# Patient Record
Sex: Female | Born: 1965 | Race: Black or African American | Hispanic: No | Marital: Married | Smoking: Former smoker
Health system: Southern US, Community
[De-identification: ages and names within clinical notes are randomized; demographics above are authoritative.]

## PROBLEM LIST (undated history)

## (undated) DIAGNOSIS — D649 Anemia, unspecified: Secondary | ICD-10-CM

## (undated) DIAGNOSIS — J189 Pneumonia, unspecified organism: Secondary | ICD-10-CM

## (undated) DIAGNOSIS — E119 Type 2 diabetes mellitus without complications: Secondary | ICD-10-CM

## (undated) DIAGNOSIS — E039 Hypothyroidism, unspecified: Secondary | ICD-10-CM

## (undated) DIAGNOSIS — M795 Residual foreign body in soft tissue: Secondary | ICD-10-CM

## (undated) DIAGNOSIS — F419 Anxiety disorder, unspecified: Secondary | ICD-10-CM

## (undated) HISTORY — PX: BREAST BIOPSY: SHX20

## (undated) HISTORY — PX: TUBAL LIGATION: SHX77

## (undated) HISTORY — PX: COLONOSCOPY W/ POLYPECTOMY: SHX1380

## (undated) HISTORY — PX: OTHER SURGICAL HISTORY: SHX169

---

## 2005-02-22 ENCOUNTER — Emergency Department: Payer: Self-pay | Admitting: Emergency Medicine

## 2005-05-29 ENCOUNTER — Emergency Department: Payer: Self-pay | Admitting: Emergency Medicine

## 2005-06-25 ENCOUNTER — Emergency Department: Payer: Self-pay | Admitting: Internal Medicine

## 2006-05-12 ENCOUNTER — Ambulatory Visit: Payer: Self-pay | Admitting: General Practice

## 2006-09-26 ENCOUNTER — Emergency Department: Payer: Self-pay | Admitting: Emergency Medicine

## 2006-10-06 ENCOUNTER — Other Ambulatory Visit: Payer: Self-pay

## 2006-10-06 ENCOUNTER — Emergency Department: Payer: Self-pay | Admitting: Emergency Medicine

## 2008-06-03 ENCOUNTER — Emergency Department: Payer: Self-pay | Admitting: Internal Medicine

## 2008-06-07 ENCOUNTER — Ambulatory Visit: Payer: Self-pay | Admitting: Internal Medicine

## 2009-02-07 ENCOUNTER — Ambulatory Visit: Payer: Self-pay | Admitting: Family Medicine

## 2009-02-15 ENCOUNTER — Ambulatory Visit: Payer: Self-pay | Admitting: Family Medicine

## 2009-08-21 ENCOUNTER — Ambulatory Visit: Payer: Self-pay | Admitting: General Surgery

## 2009-10-25 ENCOUNTER — Ambulatory Visit: Payer: Self-pay | Admitting: Gastroenterology

## 2010-08-31 ENCOUNTER — Emergency Department: Payer: Self-pay | Admitting: Emergency Medicine

## 2010-09-02 ENCOUNTER — Emergency Department: Payer: Self-pay | Admitting: Emergency Medicine

## 2011-02-05 ENCOUNTER — Emergency Department: Payer: Self-pay | Admitting: Unknown Physician Specialty

## 2011-08-11 ENCOUNTER — Emergency Department: Payer: Self-pay | Admitting: Emergency Medicine

## 2012-03-02 ENCOUNTER — Emergency Department: Payer: Self-pay | Admitting: Internal Medicine

## 2012-06-28 ENCOUNTER — Emergency Department: Payer: Self-pay | Admitting: Emergency Medicine

## 2013-02-20 ENCOUNTER — Emergency Department: Payer: Self-pay | Admitting: Emergency Medicine

## 2013-02-20 LAB — CBC
MCH: 29.5 pg (ref 26.0–34.0)
MCHC: 34.2 g/dL (ref 32.0–36.0)
Platelet: 264 10*3/uL (ref 150–440)
RDW: 13.8 % (ref 11.5–14.5)

## 2013-02-20 LAB — BASIC METABOLIC PANEL
Calcium, Total: 9.3 mg/dL (ref 8.5–10.1)
Chloride: 110 mmol/L — ABNORMAL HIGH (ref 98–107)
Co2: 23 mmol/L (ref 21–32)
Creatinine: 0.98 mg/dL (ref 0.60–1.30)
Glucose: 101 mg/dL — ABNORMAL HIGH (ref 65–99)
Osmolality: 289 (ref 275–301)
Sodium: 144 mmol/L (ref 136–145)

## 2013-02-20 LAB — TROPONIN I: Troponin-I: <0.02 ng/mL

## 2013-03-02 ENCOUNTER — Inpatient Hospital Stay: Payer: Self-pay | Admitting: Psychiatry

## 2013-03-02 LAB — COMPREHENSIVE METABOLIC PANEL
Albumin: 3.9 g/dL (ref 3.4–5.0)
Calcium, Total: 9 mg/dL (ref 8.5–10.1)
Creatinine: 0.88 mg/dL (ref 0.60–1.30)
EGFR (Non-African Amer.): >60
SGOT(AST): 29 U/L (ref 15–37)
Total Protein: 7.2 g/dL (ref 6.4–8.2)

## 2013-03-02 LAB — CBC
HCT: 39.4 % (ref 35.0–47.0)
HGB: 13.3 g/dL (ref 12.0–16.0)
MCH: 29.1 pg (ref 26.0–34.0)
MCHC: 33.7 g/dL (ref 32.0–36.0)
Platelet: 237 10*3/uL (ref 150–440)
RDW: 13.6 % (ref 11.5–14.5)

## 2013-03-02 LAB — DRUG SCREEN, URINE
Amphetamines, Ur Screen: NEGATIVE (ref ?–1000)
Benzodiazepine, Ur Scrn: NEGATIVE (ref ?–200)
Cocaine Metabolite,Ur ~~LOC~~: NEGATIVE (ref ?–300)
MDMA (Ecstasy)Ur Screen: NEGATIVE (ref ?–500)
Methadone, Ur Screen: NEGATIVE (ref ?–300)
Opiate, Ur Screen: NEGATIVE (ref ?–300)
Phencyclidine (PCP) Ur S: NEGATIVE (ref ?–25)

## 2013-03-02 LAB — URINALYSIS, COMPLETE
Bilirubin,UR: NEGATIVE
Glucose,UR: NEGATIVE mg/dL (ref 0–75)
Ketone: NEGATIVE
Nitrite: NEGATIVE
Ph: 5 (ref 4.5–8.0)
Specific Gravity: 1.005 (ref 1.003–1.030)

## 2013-03-02 LAB — TSH: Thyroid Stimulating Horm: 3.48 u[IU]/mL

## 2013-03-02 LAB — ETHANOL: Ethanol %: 0.201 % — ABNORMAL HIGH (ref 0.000–0.080)

## 2013-03-03 LAB — BEHAVIORAL MEDICINE 1 PANEL
Alkaline Phosphatase: 99 U/L (ref 50–136)
Anion Gap: 6 — ABNORMAL LOW (ref 7–16)
Basophil #: 0.1 10*3/uL (ref 0.0–0.1)
Bilirubin,Total: 0.4 mg/dL (ref 0.2–1.0)
Chloride: 109 mmol/L — ABNORMAL HIGH (ref 98–107)
Co2: 27 mmol/L (ref 21–32)
Creatinine: 0.91 mg/dL (ref 0.60–1.30)
EGFR (Non-African Amer.): >60
Eosinophil #: 0.1 10*3/uL (ref 0.0–0.7)
Eosinophil %: 2.2 %
Lymphocyte %: 49.5 %
MCHC: 33.4 g/dL (ref 32.0–36.0)
MCV: 86 fL (ref 80–100)
Monocyte #: 0.6 x10 3/mm (ref 0.2–0.9)
Monocyte %: 9.3 %
Neutrophil #: 2.4 10*3/uL (ref 1.4–6.5)
Potassium: 4.1 mmol/L (ref 3.5–5.1)
RBC: 4.45 10*6/uL (ref 3.80–5.20)
SGPT (ALT): 37 U/L (ref 12–78)
Thyroid Stimulating Horm: 3.83 u[IU]/mL
Total Protein: 6.5 g/dL (ref 6.4–8.2)
WBC: 6.4 10*3/uL (ref 3.6–11.0)

## 2014-04-13 ENCOUNTER — Ambulatory Visit: Payer: Self-pay

## 2014-06-14 ENCOUNTER — Ambulatory Visit: Payer: Self-pay | Admitting: Internal Medicine

## 2014-06-28 ENCOUNTER — Ambulatory Visit: Payer: Self-pay | Admitting: Internal Medicine

## 2014-12-08 NOTE — Discharge Summary (Signed)
PATIENT NAME:  Tina Payne, Tina Payne MR#:  [id number] DATE OF BIRTH:  [birth year]  DATE OF ADMISSION:  03/02/2013 DATE OF DISCHARGE:  03/04/2013  HOSPITAL COURSE: See dictated history and physical for details of admission. This 49 year old woman presented to the hospital seeking detox from alcohol. She gave a history of having recently had an escalation of her drinking and feeling like it had become a clear problem for her. In the hospital, the patient was treated with the usual detox protocol, showed no seizures however, no delirium, did not require any treatment for withdrawal. Vital signs remained stable. The patient's mood was initially somewhat dysphoric, but improved once she had sobered up. At the time of discharge was denying any suicidal ideation, said that her mood is feeling much. The patient was offered the opportunity of being referred for inpatient substance abuse treatment and was counseled about the utility of that.  She declined the offer. She instead preferred to follow up with outpatient treatment in the community. There was no indication for any specific medication. She was referred to contact the intensive outpatient program here at the hospital and to follow up there, also educated about Alcoholics Anonymous. The patient was educated about alcohol abuse and supported in maintaining sobriety.   DISCHARGE MEDICATIONS: None.   LABORATORY RESULTS: Admission labs showed drug screen was all negative. TSH normal at 3.48. Alcohol level 201. Chemistry panel with slightly elevated glucose at 122, on a nonfasting draw. CBC normal. Urinalysis unremarkable. Follow-up chemistry panel showed normal glucose.   MENTAL STATUS EXAMINATION AT DISCHARGE: Neatly dressed and groomed woman, looks her stated age, cooperative with the interview. Good eye contact. Normal psychomotor activity. Speech normal rate, tone and volume. Affect euthymic, reactive, appropriate. Mood stated as good. Thoughts lucid and directed  with no sign of loosening associations or delusions. Denies auditory or visual hallucinations. Denied suicidal or homicidal ideation. Good insight and judgment. Normal intelligence. Alert and oriented x 4.   DISPOSITION: Discharge home. Follow up with the intensive outpatient program and Alcoholics Anonymous.   DIAGNOSIS, PRINCIPAL AND PRIMARY:  AXIS I: Alcohol dependence.   SECONDARY DIAGNOSES: AXIS I: Substance-induced depression. AXIS II:  Deferred.  AXIS III: No diagnosis.  AXIS IV: Moderate - stress from relationship issues.  AXIS V: Functioning at time of discharge 60.  ____________________________ Audery Amel, MD jtc:sb D: 03/10/2013 11:54:38 ET T: 03/10/2013 12:14:48 ET JOB#: [id number]  cc: Audery Amel, MD, <Dictator> Audery Amel MD ELECTRONICALLY SIGNED 03/11/2013 9:26
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
## 2014-12-08 NOTE — Consult Note (Signed)
PATIENT NAME:  Tina Payne, Tina Payne MR#:  [id number] DATE OF BIRTH:  May 25, 1966  DATE OF CONSULTATION:  03/02/2013  REFERRING PHYSICIAN:   CONSULTING PHYSICIAN:  Izola Price. Jaclynn Major, MD  CHIEF COMPLAINTL: "I have been drinking every day, I'm sick off it."   HISTORY OF PRESENT ILLNESS: [name] is 49 year old female, who presents saying that she wants to be detoxed. She reports that she has talked to her administrator and her charge working nurse and wants to stop. "I'm about to have a nervous breakdown." "I drink every day. When I am not working I do not know of any time when I am not drinking and I do not want to hurt myself and I am here for you to help me."   SUICIDE ASSESSMENT: She denies any suicidal ideation, intent or plan. She reports that she does have support from her daughter, although "my husband is an alcoholic, he drinks all the time." She denies any history of suicide attempts.   She denies any homicidal ideation, intent or plan, but she is a heavy substance abuser. She does not have any thoughts of harming others.   ALCOHOL AND DRUG USE: She denies any use of drugs.   She reports that she first started drinking when she was 49 years old and she last used on 03/01/2013. She reports that she drinks a six pack to 12 beers 7 days of the week. She denies any history of seizures or blackouts. She reports that she would not know if she is having withdrawals, but she does feel sort of headachy and tired. Mrs. Acors BAL was 201 on admission.   MENTAL STATUS EXAM: [name] is appropriately dressed and groomed. She is cooperative and pleasant. She denies any problems sleeping. She appears to be anxious and depressed. She has thought content that is sad and anxious. Her thought processes are linear, logical and goal oriented. Her memory is intact. She denies any auditory, visual hallucinations or delusions. She denies any suicidal or homicidal ideations, intent or plan. She is  oriented x 4. Her speech is soft and slightly slurred. Her insight and judgment are fair. She reports that she has lost about 30 pounds in the last year.   SOCIAL HISTORY: [name] reports she lives with her husband and does not believe her living situation is stable because he drinks,  but she says that she can return there if she needs to. She feels safe there. She reports her daughter is her support system. She says of her family, her daughter is one that helps her.   MEDICATIONS: None.   ALLERGIES: No known drug allergies.   PAST MEDICAL/SURGICAL HISTORY: Gestational diabetes, tubal ligation, laparoscopy and a tubal pregnancy.    DIAGNOSIS, PRINCIPAL AND PRIMARY: Alcohol dependence.   TREATMENT PLAN: Admit [name] to [facility] psychiatric unit for detox.  ____________________________ Izola Price Jaclynn Major, MD fcg:aw D: 03/02/2013 14:22:38 ET T: 03/02/2013 14:37:57 ET JOB#: [id number]  cc: Izola Price. Jaclynn Major, MD, <Dictator> Maryan Puls MD ELECTRONICALLY SIGNED 03/02/2013 15:17

## 2016-03-10 IMAGING — MG MM DIGITAL SCREENING BILAT W/ CAD
2 series · 5 of 5 positions shown · non-contrast
Comparison: Previous Exam(s)

CLINICAL DATA: Screening.

EXAM:
DIGITAL SCREENING BILATERAL MAMMOGRAM WITH CAD

[R CC · right · 4 of 4 slices shown]
[im 1/4]
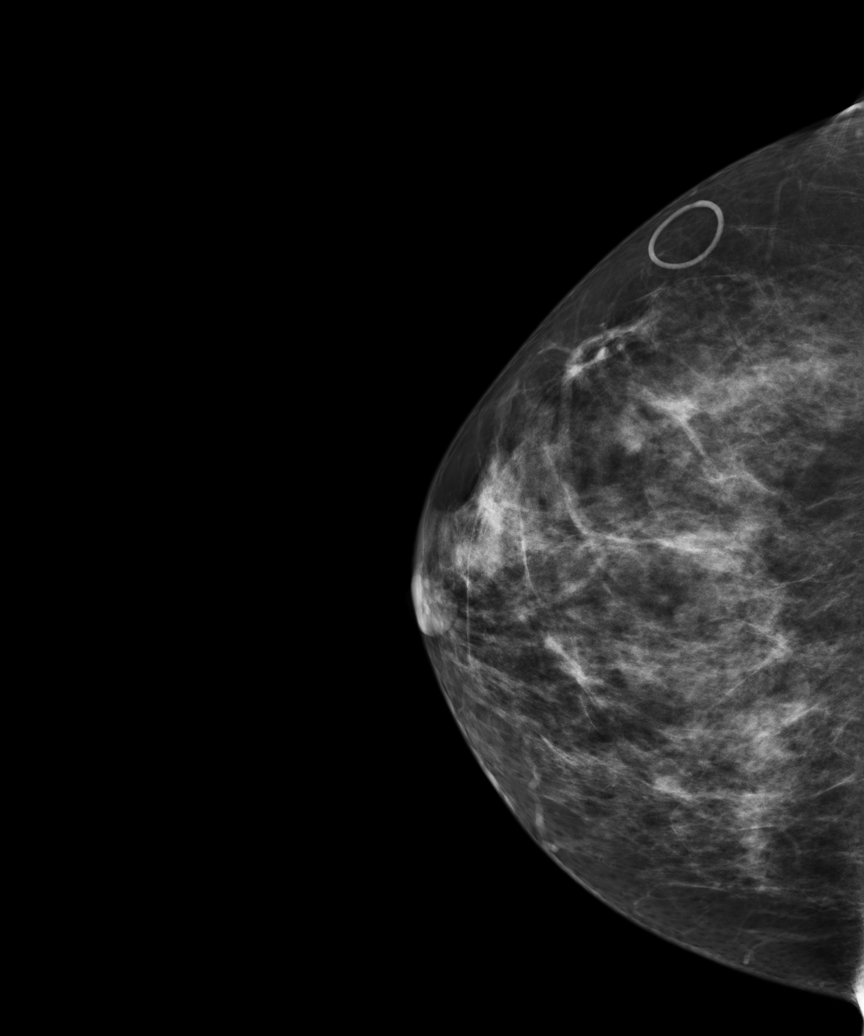
[im 2/4]
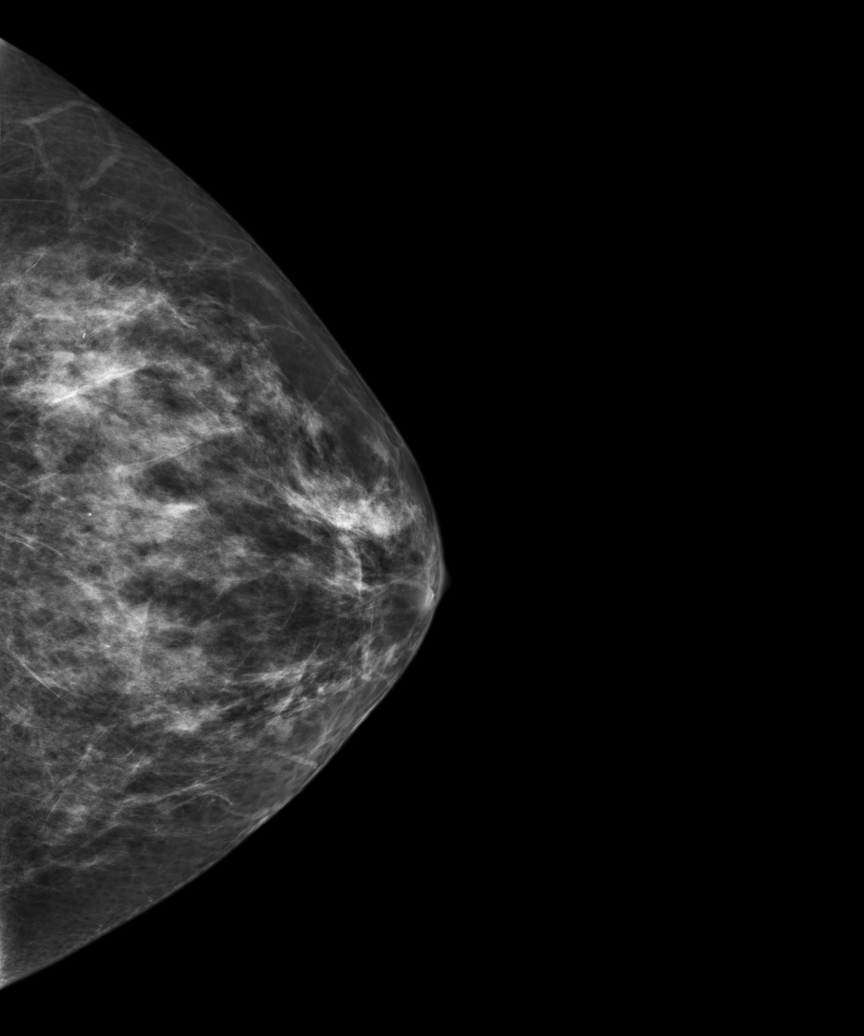
[im 3/4]
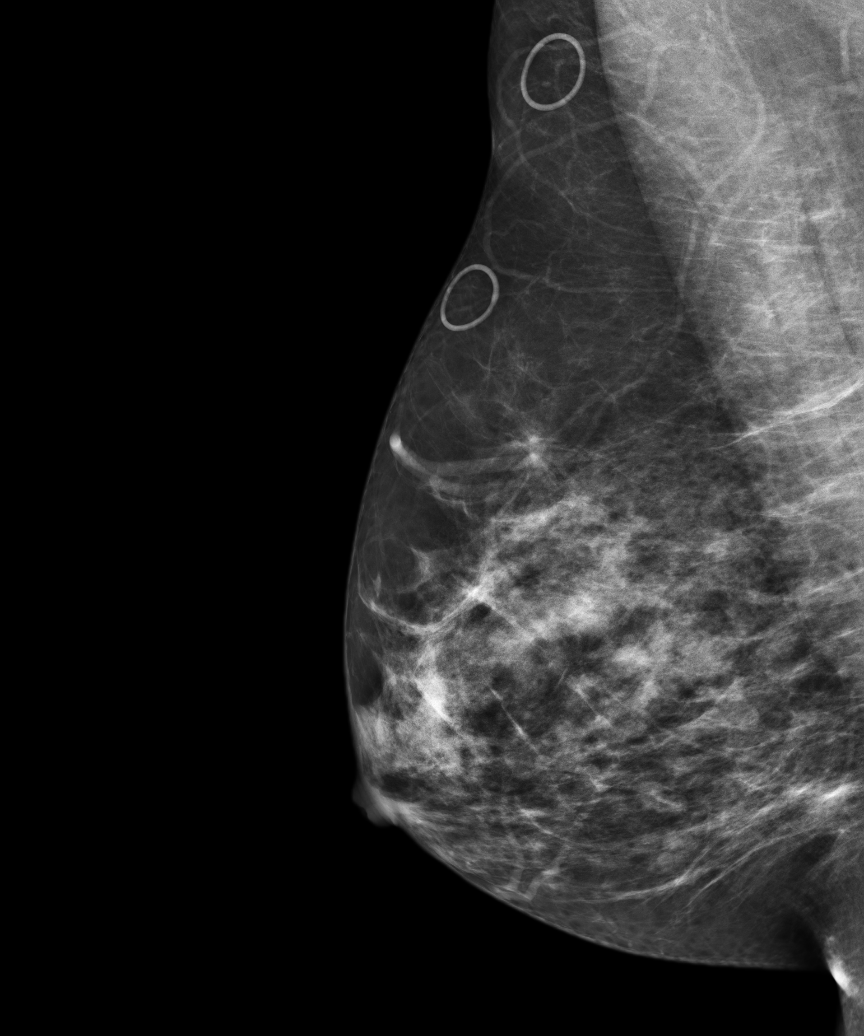
[im 4/4]
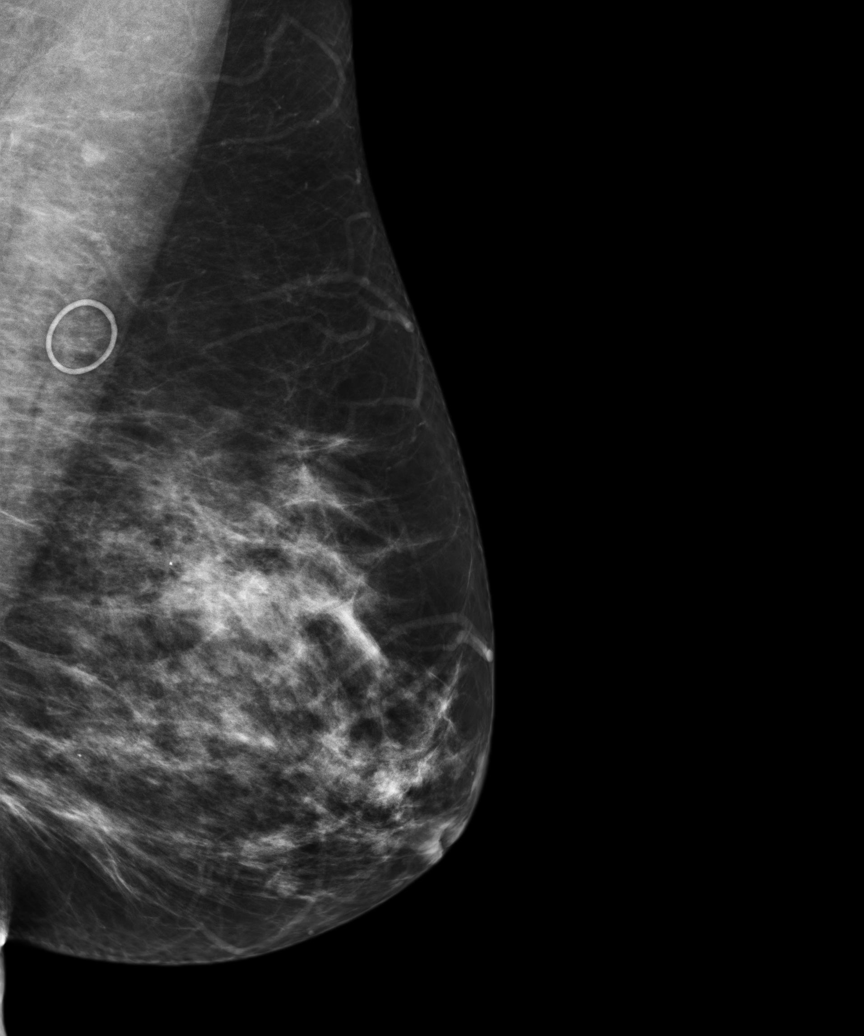

[R MLO]
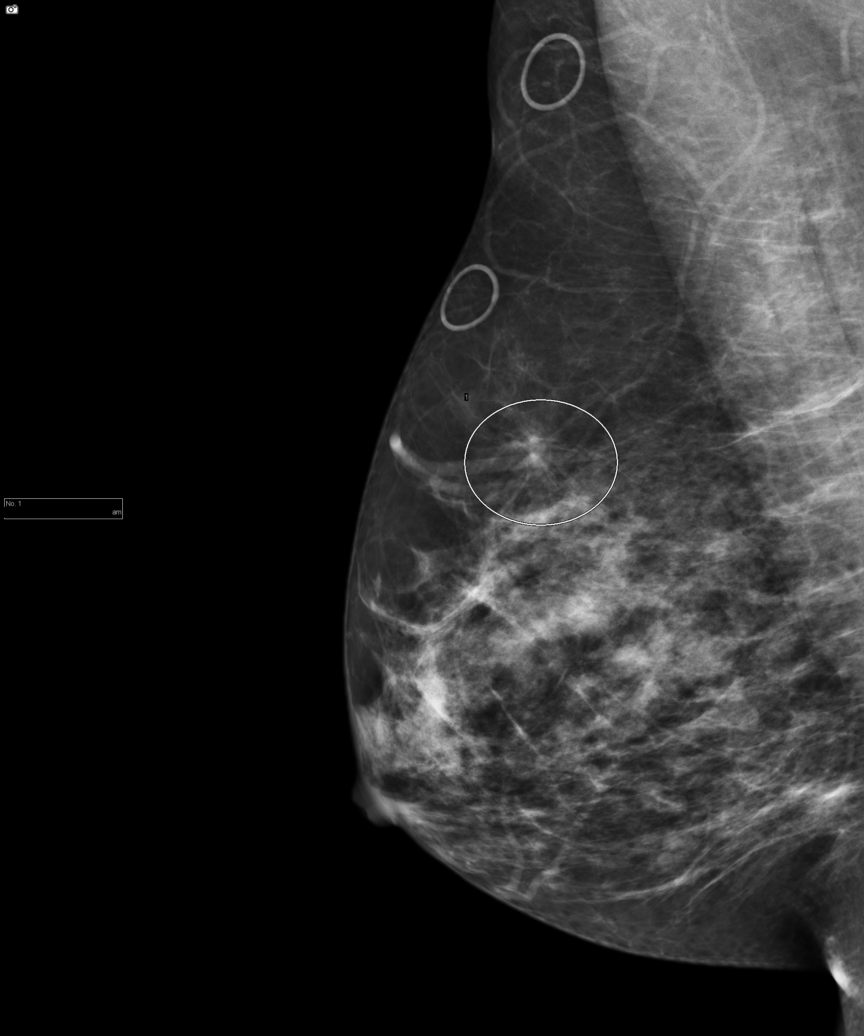

[5 of 5 positions shown; findings below may reference images not displayed]

ACR Breast Density Category c: The breast tissue is heterogeneously
dense, which may obscure small masses.
FINDINGS: In the right breast, possible distortion warrants further evaluation
with spot compression views and possibly ultrasound. In the left
breast, no findings suspicious for malignancy. Images were processed
with CAD.
IMPRESSION: Further evaluation is suggested for possible distortion in the right
breast.

RECOMMENDATION:
Diagnostic mammogram and possibly ultrasound of the right breast.
(Code:[id number])

The patient will be contacted regarding the findings, and additional
imaging will be scheduled.

BI-RADS CATEGORY  0: Incomplete. Need additional imaging evaluation
and/or prior mammograms for comparison.

## 2017-06-05 ENCOUNTER — Emergency Department
Admission: EM | Admit: 2017-06-05 | Discharge: 2017-06-05 | Disposition: A | Discharge status: Home / Self Care | Payer: BLUE CROSS/BLUE SHIELD | Attending: Emergency Medicine | Admitting: Emergency Medicine

## 2017-06-05 ENCOUNTER — Encounter: Payer: Self-pay | Admitting: Emergency Medicine

## 2017-06-05 DIAGNOSIS — J069 Acute upper respiratory infection, unspecified: Secondary | ICD-10-CM | POA: Diagnosis not present

## 2017-06-05 DIAGNOSIS — F17210 Nicotine dependence, cigarettes, uncomplicated: Secondary | ICD-10-CM

## 2017-06-05 DIAGNOSIS — F17200 Nicotine dependence, unspecified, uncomplicated: Secondary | ICD-10-CM | POA: Insufficient documentation

## 2017-06-05 DIAGNOSIS — R0981 Nasal congestion: Secondary | ICD-10-CM | POA: Diagnosis present

## 2017-06-05 MED ORDER — PSEUDOEPH-BROMPHEN-DM 30-2-10 MG/5ML PO SYRP: 5.0000 mL | ORAL_SOLUTION | Freq: Four times a day (QID) | ORAL | 0 refills | Status: DC | PRN

## 2017-06-05 NOTE — Discharge Instructions (Signed)
Follow-up with your primary care provider or [facility] if any continued problems. Begin taking cough medication as directed and increase fluids. Decrease smoking.

## 2017-06-05 NOTE — ED Triage Notes (Addendum)
Pt to ed with c/o cough, congestion and sore throat that started last Sunday.  Pt states can't sleep due to coughing at night.  Pt with mask on at triage. Denies fever. Breathing east and unlabored at triage.

## 2017-06-05 NOTE — ED Provider Notes (Signed)
[facility] Emergency Department Provider Note  ____________________________________________   First MD Initiated Contact with Patient 06/05/17 1230     (approximate)  I have reviewed the triage vital signs and the nursing notes.   HISTORY  Chief Complaint Cough and Nasal Congestion   HPI Tina Payne is a 51 y.o. female is here with complaint of congestion, sore throat and cough for last 5 days. Patient has not been taking any medication as she was seen in [facility] and placed on a muscle relaxant. She was confused if she could take any medication for her cough with this medication. She denies any fever. Patient continues to smoke.  History reviewed. No pertinent past medical history.  There are no active problems to display for this patient.   History reviewed. No pertinent surgical history.  Prior to Admission medications   Medication Sig Start Date End Date Taking? Authorizing Provider  brompheniramine-pseudoephedrine-DM 30-2-10 MG/5ML syrup Take 5 mLs by mouth 4 (four) times daily as needed. 06/05/17   Tommi RumpsSummers, Rhonda L, PA-C    Allergies Patient has no known allergies.  History reviewed. No pertinent family history.  Social History Social History  Substance Use Topics  . Smoking status: Current Every Day Smoker  . Smokeless tobacco: Never Used  . Alcohol use No    Review of Systems Constitutional: No fever/chills ENT: No sore throat. Cardiovascular: Denies chest pain. Respiratory: Denies shortness of breath.  Positive for cough. Gastrointestinal: No abdominal pain.  No nausea, no vomiting.  Musculoskeletal: Negative for back pain. Neurological: Negative for headaches ____________________________________________   PHYSICAL EXAM:  VITAL SIGNS: ED Triage Vitals  Enc Vitals Group     BP 06/05/17 1122 138/90     Pulse Rate 06/05/17 1122 78     Resp 06/05/17 1122 18     Temp 06/05/17 1122 98.4 F (36.9 C)     Temp  Source 06/05/17 1122 Oral     SpO2 06/05/17 1122 99 %     Weight 06/05/17 1122 164 lb (74.4 kg)     Height 06/05/17 1122 5\' 8"  (1.727 m)     Head Circumference --      Peak Flow --      Pain Score 06/05/17 1134 7     Pain Loc --      Pain Edu? --      Excl. in GC? --    Constitutional: Alert and oriented. Well appearing and in no acute distress. Eyes: Conjunctivae are normal.  Head: Atraumatic. Neck: No stridor.   Ears: EACs and TMs are clear bilaterally. Nasal mucosa boggy. Throat:  Moderate posterior drainage noted. No exudates or erythema present. Cardiovascular: Normal rate, regular rhythm. Grossly normal heart sounds.  Good peripheral circulation. Respiratory: Normal respiratory effort.  No retractions. Lungs CTAB.  Course cough. Musculoskeletal: moves upper and lower extremities without any difficulty. Normal gait was noted. Neurologic:  Normal speech and language. No gross focal neurologic deficits are appreciated.  Skin:  Skin is warm, dry and intact.  Psychiatric: Mood and affect are normal. Speech and behavior are normal.  ____________________________________________   LABS (all labs ordered are listed, but only abnormal results are displayed)  Labs Reviewed - No data to display   PROCEDURES  Procedure(s) performed: None  Procedures  Critical Care performed: No  ____________________________________________   INITIAL IMPRESSION / ASSESSMENT AND PLAN / ED COURSE  Patient is given a prescription for Bromfed-DM. She is encouraged to increase fluids and decrease smoking. She is  to follow-up with her PCP up in no clinic if any continued problem's.   ___________________________________________   FINAL CLINICAL IMPRESSION(S) / ED DIAGNOSES  Final diagnoses:  Upper respiratory tract infection, unspecified type  Cigarette smoker      NEW MEDICATIONS STARTED DURING THIS VISIT:  Discharge Medication List as of 06/05/2017 12:51 PM    START taking these  medications   Details  brompheniramine-pseudoephedrine-DM 30-2-10 MG/5ML syrup Take 5 mLs by mouth 4 (four) times daily as needed., Starting Fri 06/05/2017, Print         Note:  This document was prepared using Dragon voice recognition software and may include unintentional dictation errors.    Tommi Rumps, PA-C 06/05/17 1728    Minna Antis, MD 06/08/17 1549

## 2017-10-07 ENCOUNTER — Other Ambulatory Visit: Payer: Self-pay | Admitting: Internal Medicine

## 2017-10-07 DIAGNOSIS — R739 Hyperglycemia, unspecified: Secondary | ICD-10-CM | POA: Insufficient documentation

## 2017-10-07 DIAGNOSIS — E7800 Pure hypercholesterolemia, unspecified: Secondary | ICD-10-CM | POA: Insufficient documentation

## 2017-10-07 DIAGNOSIS — Z1231 Encounter for screening mammogram for malignant neoplasm of breast: Secondary | ICD-10-CM

## 2017-10-27 ENCOUNTER — Ambulatory Visit
Admission: RE | Admit: 2017-10-27 | Discharge: 2017-10-27 | Disposition: A | Discharge status: Home / Self Care | Payer: BLUE CROSS/BLUE SHIELD | Source: Ambulatory Visit | Attending: Internal Medicine | Admitting: Internal Medicine

## 2017-10-27 DIAGNOSIS — Z1231 Encounter for screening mammogram for malignant neoplasm of breast: Secondary | ICD-10-CM | POA: Diagnosis present

## 2018-04-12 DIAGNOSIS — E063 Autoimmune thyroiditis: Secondary | ICD-10-CM | POA: Insufficient documentation

## 2018-04-12 DIAGNOSIS — E119 Type 2 diabetes mellitus without complications: Secondary | ICD-10-CM | POA: Insufficient documentation

## 2018-04-12 DIAGNOSIS — E1165 Type 2 diabetes mellitus with hyperglycemia: Secondary | ICD-10-CM | POA: Insufficient documentation

## 2019-03-14 ENCOUNTER — Other Ambulatory Visit: Payer: Self-pay | Admitting: Internal Medicine

## 2019-03-14 DIAGNOSIS — Z1231 Encounter for screening mammogram for malignant neoplasm of breast: Secondary | ICD-10-CM

## 2019-06-08 DIAGNOSIS — D649 Anemia, unspecified: Secondary | ICD-10-CM | POA: Insufficient documentation

## 2019-07-27 ENCOUNTER — Ambulatory Visit
Admission: RE | Admit: 2019-07-27 | Discharge: 2019-07-27 | Disposition: A | Discharge status: Home / Self Care | Payer: Managed Care, Other (non HMO) | Source: Ambulatory Visit | Attending: Internal Medicine | Admitting: Internal Medicine

## 2019-07-27 ENCOUNTER — Other Ambulatory Visit: Payer: Self-pay

## 2019-07-27 DIAGNOSIS — Z1231 Encounter for screening mammogram for malignant neoplasm of breast: Secondary | ICD-10-CM | POA: Insufficient documentation

## 2019-09-13 DIAGNOSIS — E538 Deficiency of other specified B group vitamins: Secondary | ICD-10-CM | POA: Insufficient documentation

## 2019-09-13 DIAGNOSIS — Z87891 Personal history of nicotine dependence: Secondary | ICD-10-CM | POA: Insufficient documentation

## 2020-04-26 ENCOUNTER — Other Ambulatory Visit: Payer: Self-pay | Admitting: Internal Medicine

## 2020-04-26 DIAGNOSIS — Z1231 Encounter for screening mammogram for malignant neoplasm of breast: Secondary | ICD-10-CM

## 2020-09-07 ENCOUNTER — Other Ambulatory Visit: Payer: Self-pay | Admitting: Internal Medicine

## 2020-09-07 DIAGNOSIS — Z1231 Encounter for screening mammogram for malignant neoplasm of breast: Secondary | ICD-10-CM

## 2020-10-05 ENCOUNTER — Other Ambulatory Visit: Payer: Self-pay

## 2020-10-05 ENCOUNTER — Ambulatory Visit
Admission: RE | Admit: 2020-10-05 | Discharge: 2020-10-05 | Disposition: A | Discharge status: Home / Self Care | Payer: Managed Care, Other (non HMO) | Source: Ambulatory Visit | Attending: Internal Medicine | Admitting: Internal Medicine

## 2020-10-05 DIAGNOSIS — Z1231 Encounter for screening mammogram for malignant neoplasm of breast: Secondary | ICD-10-CM | POA: Diagnosis present

## 2021-01-24 ENCOUNTER — Other Ambulatory Visit: Payer: Self-pay

## 2021-01-24 ENCOUNTER — Emergency Department
Admission: EM | Admit: 2021-01-24 | Discharge: 2021-01-24 | Disposition: A | Discharge status: Home / Self Care | Payer: BC Managed Care – PPO | Attending: Emergency Medicine | Admitting: Emergency Medicine

## 2021-01-24 ENCOUNTER — Encounter: Payer: Self-pay | Admitting: Emergency Medicine

## 2021-01-24 ENCOUNTER — Emergency Department: Payer: BC Managed Care – PPO

## 2021-01-24 DIAGNOSIS — Z87891 Personal history of nicotine dependence: Secondary | ICD-10-CM | POA: Diagnosis not present

## 2021-01-24 DIAGNOSIS — E119 Type 2 diabetes mellitus without complications: Secondary | ICD-10-CM | POA: Diagnosis not present

## 2021-01-24 DIAGNOSIS — E039 Hypothyroidism, unspecified: Secondary | ICD-10-CM | POA: Diagnosis not present

## 2021-01-24 DIAGNOSIS — B349 Viral infection, unspecified: Secondary | ICD-10-CM | POA: Insufficient documentation

## 2021-01-24 DIAGNOSIS — J029 Acute pharyngitis, unspecified: Secondary | ICD-10-CM | POA: Diagnosis present

## 2021-01-24 DIAGNOSIS — Z20822 Contact with and (suspected) exposure to covid-19: Secondary | ICD-10-CM | POA: Insufficient documentation

## 2021-01-24 LAB — RESP PANEL BY RT-PCR (FLU A&B, COVID) ARPGX2
Influenza A by PCR: NEGATIVE
Influenza B by PCR: NEGATIVE
SARS Coronavirus 2 by RT PCR: NEGATIVE

## 2021-01-24 LAB — CBG MONITORING, ED: Glucose-Capillary: 175 mg/dL — ABNORMAL HIGH (ref 70–99)

## 2021-01-24 MED ORDER — PSEUDOEPHEDRINE HCL 30 MG PO TABS: 30.0000 mg | ORAL_TABLET | Freq: Four times a day (QID) | ORAL | 2 refills | Status: AC | PRN
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
## 2021-01-24 NOTE — ED Triage Notes (Signed)
Patient ambulatory to triage with steady gait, without difficulty or distress noted; pt reports sore throat since Monday with sinus drainage and prod cough brown sputum

## 2021-01-24 NOTE — ED Notes (Signed)
Patient transported to X-ray 

## 2021-01-24 NOTE — ED Notes (Signed)
ED Provider at bedside. 

## 2021-09-20 ENCOUNTER — Other Ambulatory Visit: Payer: Self-pay | Admitting: Internal Medicine

## 2021-09-20 DIAGNOSIS — Z1231 Encounter for screening mammogram for malignant neoplasm of breast: Secondary | ICD-10-CM

## 2021-10-24 ENCOUNTER — Ambulatory Visit
Admission: RE | Admit: 2021-10-24 | Discharge: 2021-10-24 | Disposition: A | Discharge status: Home / Self Care | Payer: Commercial Managed Care - HMO | Source: Ambulatory Visit | Attending: Internal Medicine | Admitting: Internal Medicine

## 2021-10-24 ENCOUNTER — Other Ambulatory Visit: Payer: Self-pay

## 2021-10-24 DIAGNOSIS — Z1231 Encounter for screening mammogram for malignant neoplasm of breast: Secondary | ICD-10-CM | POA: Diagnosis present

## 2022-10-03 ENCOUNTER — Emergency Department
Admission: EM | Admit: 2022-10-03 | Discharge: 2022-10-03 | Disposition: A | Discharge status: Home / Self Care | Payer: 59 | Attending: Emergency Medicine | Admitting: Emergency Medicine

## 2022-10-03 ENCOUNTER — Other Ambulatory Visit: Payer: Self-pay

## 2022-10-03 DIAGNOSIS — J028 Acute pharyngitis due to other specified organisms: Secondary | ICD-10-CM | POA: Insufficient documentation

## 2022-10-03 DIAGNOSIS — J029 Acute pharyngitis, unspecified: Secondary | ICD-10-CM

## 2022-10-03 DIAGNOSIS — E119 Type 2 diabetes mellitus without complications: Secondary | ICD-10-CM | POA: Diagnosis not present

## 2022-10-03 DIAGNOSIS — R070 Pain in throat: Secondary | ICD-10-CM | POA: Diagnosis not present

## 2022-10-03 DIAGNOSIS — Z20822 Contact with and (suspected) exposure to covid-19: Secondary | ICD-10-CM | POA: Diagnosis not present

## 2022-10-03 DIAGNOSIS — B9789 Other viral agents as the cause of diseases classified elsewhere: Secondary | ICD-10-CM | POA: Insufficient documentation

## 2022-10-03 LAB — RESP PANEL BY RT-PCR (RSV, FLU A&B, COVID)  RVPGX2
Influenza A by PCR: NEGATIVE
Influenza B by PCR: NEGATIVE
Resp Syncytial Virus by PCR: NEGATIVE
SARS Coronavirus 2 by RT PCR: NEGATIVE

## 2022-10-03 LAB — GROUP A STREP BY PCR: Group A Strep by PCR: NOT DETECTED

## 2022-10-03 MED ORDER — IBUPROFEN 400 MG PO TABS
400.0000 mg | ORAL_TABLET | Freq: Once | ORAL | Status: AC
  Administered 2022-10-03: 400 mg via ORAL
  Filled 2022-10-03: qty 1

## 2022-10-03 NOTE — ED Provider Notes (Signed)
[facility] Provider Note    Event Date/Time   First MD Initiated Contact with Patient 10/03/22 [phone]     (approximate)   History   Chief Complaint Sore Throat   HPI  Tina Payne is a 57 y.o. female with past medical history of diabetes who presents to the ED complaining of sore throat.  Patient reports that she has had about 2 days of pain in the back of her throat, especially when she goes to swallow.  This been associated with diffuse headache, but she denies any fevers or neck stiffness.  She does endorse a nonproductive cough, denies any chest pain or difficulty breathing.  She is not aware of any sick contacts.     Physical Exam   Triage Vital Signs: ED Triage Vitals  Enc Vitals Group     BP 10/03/22 0852 113/78     Pulse Rate 10/03/22 0852 97     Resp 10/03/22 0852 18     Temp 10/03/22 0852 98 F (36.7 C)     Temp src --      SpO2 10/03/22 0852 100 %     Weight --      Height --      Head Circumference --      Peak Flow --      Pain Score 10/03/22 0851 5     Pain Loc --      Pain Edu? --      Excl. in [place]? --     Most recent vital signs: Vitals:   10/03/22 0852  BP: 113/78  Pulse: 97  Resp: 18  Temp: 98 F (36.7 C)  SpO2: 100%    Constitutional: Alert and oriented. Eyes: Conjunctivae are normal. Head: Atraumatic. Nose: No congestion/rhinnorhea. Mouth/Throat: Mucous membranes are moist.  Posterior oropharynx with erythema bilaterally, no edema or exudates noted. Cardiovascular: Normal rate, regular rhythm. Grossly normal heart sounds.  2+ radial pulses bilaterally. Respiratory: Normal respiratory effort.  No retractions. Lungs CTAB. Gastrointestinal: Soft and nontender. No distention. Musculoskeletal: No lower extremity tenderness nor edema.  Neurologic:  Normal speech and language. No gross focal neurologic deficits are appreciated.    ED Results / Procedures / Treatments   Labs (all labs ordered are listed,  but only abnormal results are displayed) Labs Reviewed  RESP PANEL BY RT-PCR (RSV, FLU A&B, COVID)  [id number]  GROUP A STREP BY PCR    PROCEDURES:  Critical Care performed: No  Procedures   MEDICATIONS ORDERED IN ED: Medications  ibuprofen (ADVIL) tablet 400 mg (400 mg Oral Given 10/03/22 0947)     IMPRESSION / MDM / [place] / ED COURSE  I reviewed the triage vital signs and the nursing notes.                              57 y.o. female with past medical history of diabetes who presents to the ED complaining of 2 days of sore throat, headache, and nonproductive cough.  Patient's presentation is most consistent with acute complicated illness / injury requiring diagnostic workup.  Differential diagnosis includes, but is not limited to, strep pharyngitis, viral pharyngitis, COVID-19, influenza, other viral syndrome.  Patient well-appearing and in no acute distress, vital signs are unremarkable.  She has erythema to her posterior oropharynx but no edema or exudates noted.  Strep testing is negative, testing for COVID-19, influenza, and RSV is also negative.  No findings  concerning for meningitis, symptoms consistent with viral pharyngitis or other viral syndrome.  Patient given dose of ibuprofen for pain, was counseled to continue Tylenol and ibuprofen at home.  She was counseled to return to the ED for new or worsening symptoms, patient agrees with plan.      FINAL CLINICAL IMPRESSION(S) / ED DIAGNOSES   Final diagnoses:  Viral pharyngitis     Rx / DC Orders   ED Discharge Orders     None        Note:  This document was prepared using Dragon voice recognition software and may include unintentional dictation errors.   Blake Divine, MD 10/03/22 [phone]

## 2022-10-03 NOTE — ED Triage Notes (Signed)
Pt comes with c/o sore throat, cough, headache and body aches. Pt states this all started 4 days ago.

## 2022-10-09 DIAGNOSIS — H109 Unspecified conjunctivitis: Secondary | ICD-10-CM | POA: Diagnosis not present

## 2022-10-09 DIAGNOSIS — J029 Acute pharyngitis, unspecified: Secondary | ICD-10-CM | POA: Diagnosis not present

## 2022-10-28 DIAGNOSIS — N6314 Unspecified lump in the right breast, lower inner quadrant: Secondary | ICD-10-CM | POA: Diagnosis not present

## 2022-10-29 ENCOUNTER — Other Ambulatory Visit: Payer: Self-pay | Admitting: Family Medicine

## 2022-10-29 DIAGNOSIS — N6314 Unspecified lump in the right breast, lower inner quadrant: Secondary | ICD-10-CM

## 2022-11-06 ENCOUNTER — Ambulatory Visit
Admission: RE | Admit: 2022-11-06 | Discharge: 2022-11-06 | Disposition: A | Discharge status: Home / Self Care | Payer: 59 | Source: Ambulatory Visit | Attending: Family Medicine | Admitting: Family Medicine

## 2022-11-06 DIAGNOSIS — E785 Hyperlipidemia, unspecified: Secondary | ICD-10-CM | POA: Diagnosis not present

## 2022-11-06 DIAGNOSIS — N6314 Unspecified lump in the right breast, lower inner quadrant: Secondary | ICD-10-CM

## 2022-11-06 DIAGNOSIS — R922 Inconclusive mammogram: Secondary | ICD-10-CM | POA: Diagnosis not present

## 2022-11-06 DIAGNOSIS — N6312 Unspecified lump in the right breast, upper inner quadrant: Secondary | ICD-10-CM | POA: Diagnosis not present

## 2022-11-06 DIAGNOSIS — E1169 Type 2 diabetes mellitus with other specified complication: Secondary | ICD-10-CM | POA: Diagnosis not present

## 2022-11-07 ENCOUNTER — Encounter: Payer: Self-pay | Admitting: Family Medicine

## 2022-11-12 ENCOUNTER — Other Ambulatory Visit: Payer: Self-pay | Admitting: Family Medicine

## 2022-11-12 ENCOUNTER — Encounter: Payer: Self-pay | Admitting: Family Medicine

## 2022-11-12 DIAGNOSIS — N63 Unspecified lump in unspecified breast: Secondary | ICD-10-CM

## 2022-11-12 DIAGNOSIS — R928 Other abnormal and inconclusive findings on diagnostic imaging of breast: Secondary | ICD-10-CM

## 2022-11-13 DIAGNOSIS — E063 Autoimmune thyroiditis: Secondary | ICD-10-CM | POA: Diagnosis not present

## 2022-11-13 DIAGNOSIS — E785 Hyperlipidemia, unspecified: Secondary | ICD-10-CM | POA: Diagnosis not present

## 2022-11-13 DIAGNOSIS — E559 Vitamin D deficiency, unspecified: Secondary | ICD-10-CM | POA: Diagnosis not present

## 2022-11-13 DIAGNOSIS — E1169 Type 2 diabetes mellitus with other specified complication: Secondary | ICD-10-CM | POA: Diagnosis not present

## 2022-11-13 DIAGNOSIS — E210 Primary hyperparathyroidism: Secondary | ICD-10-CM | POA: Diagnosis not present

## 2022-11-19 ENCOUNTER — Ambulatory Visit
Admission: RE | Admit: 2022-11-19 | Discharge: 2022-11-19 | Disposition: A | Discharge status: Home / Self Care | Payer: 59 | Source: Ambulatory Visit | Attending: Family Medicine | Admitting: Family Medicine

## 2022-11-19 DIAGNOSIS — N63 Unspecified lump in unspecified breast: Secondary | ICD-10-CM | POA: Insufficient documentation

## 2022-11-19 DIAGNOSIS — R928 Other abnormal and inconclusive findings on diagnostic imaging of breast: Secondary | ICD-10-CM

## 2022-11-19 HISTORY — PX: BREAST BIOPSY: SHX20

## 2022-11-19 MED ORDER — CHLOROPROCAINE HCL 2 % IJ SOLN: 8.0000 mL | Freq: Once | INTRAMUSCULAR | Status: DC

## 2022-11-21 ENCOUNTER — Encounter: Payer: Self-pay | Admitting: *Deleted

## 2022-11-21 DIAGNOSIS — C50919 Malignant neoplasm of unspecified site of unspecified female breast: Secondary | ICD-10-CM

## 2022-11-21 LAB — SURGICAL PATHOLOGY

## 2022-11-24 ENCOUNTER — Encounter: Payer: Self-pay | Admitting: *Deleted

## 2022-11-24 ENCOUNTER — Telehealth: Payer: Self-pay

## 2022-11-24 NOTE — Progress Notes (Signed)
Tina Payne will see Dr. Tonna Boehringer tomorrow at 3:00, appt. Details given to her.

## 2022-11-24 NOTE — Telephone Encounter (Signed)
Courtesy call made to NP to introduce CC services. No answer. Detailed message left with appt details and CB# for any questions.

## 2022-11-25 ENCOUNTER — Inpatient Hospital Stay: Payer: Medicaid Other | Admitting: Licensed Clinical Social Worker

## 2022-11-25 ENCOUNTER — Inpatient Hospital Stay: Payer: Medicaid Other | Attending: Oncology | Admitting: Oncology

## 2022-11-25 ENCOUNTER — Ambulatory Visit: Payer: Self-pay | Admitting: Surgery

## 2022-11-25 ENCOUNTER — Other Ambulatory Visit: Payer: 59

## 2022-11-25 ENCOUNTER — Encounter: Payer: Self-pay | Admitting: *Deleted

## 2022-11-25 ENCOUNTER — Inpatient Hospital Stay: Payer: Medicaid Other

## 2022-11-25 ENCOUNTER — Encounter: Payer: Self-pay | Admitting: Oncology

## 2022-11-25 VITALS — BP 110/80 | HR 72 | Temp 97.0°F | Resp 17 | Wt 189.0 lb

## 2022-11-25 DIAGNOSIS — C50919 Malignant neoplasm of unspecified site of unspecified female breast: Secondary | ICD-10-CM

## 2022-11-25 DIAGNOSIS — Z803 Family history of malignant neoplasm of breast: Secondary | ICD-10-CM

## 2022-11-25 DIAGNOSIS — Z8042 Family history of malignant neoplasm of prostate: Secondary | ICD-10-CM

## 2022-11-25 DIAGNOSIS — C50911 Malignant neoplasm of unspecified site of right female breast: Secondary | ICD-10-CM

## 2022-11-25 DIAGNOSIS — Z7189 Other specified counseling: Secondary | ICD-10-CM | POA: Insufficient documentation

## 2022-11-25 DIAGNOSIS — C50211 Malignant neoplasm of upper-inner quadrant of right female breast: Secondary | ICD-10-CM

## 2022-11-25 DIAGNOSIS — Z87891 Personal history of nicotine dependence: Secondary | ICD-10-CM | POA: Diagnosis not present

## 2022-11-25 DIAGNOSIS — R748 Abnormal levels of other serum enzymes: Secondary | ICD-10-CM

## 2022-11-25 DIAGNOSIS — Z79899 Other long term (current) drug therapy: Secondary | ICD-10-CM | POA: Diagnosis not present

## 2022-11-25 DIAGNOSIS — Z801 Family history of malignant neoplasm of trachea, bronchus and lung: Secondary | ICD-10-CM

## 2022-11-25 HISTORY — DX: Malignant neoplasm of unspecified site of unspecified female breast: C50.919

## 2022-11-25 LAB — CBC WITH DIFFERENTIAL (CANCER CENTER ONLY)
Abs Immature Granulocytes: 0.02 10*3/uL (ref 0.00–0.07)
Basophils Absolute: 0 10*3/uL (ref 0.0–0.1)
Basophils Relative: 1 %
Eosinophils Absolute: 0.1 10*3/uL (ref 0.0–0.5)
Eosinophils Relative: 2 %
HCT: 38 % (ref 36.0–46.0)
Hemoglobin: 11.7 g/dL — ABNORMAL LOW (ref 12.0–15.0)
Immature Granulocytes: 0 %
Lymphocytes Relative: 45 %
Lymphs Abs: 2.6 10*3/uL (ref 0.7–4.0)
MCH: 25.3 pg — ABNORMAL LOW (ref 26.0–34.0)
MCHC: 30.8 g/dL (ref 30.0–36.0)
MCV: 82.3 fL (ref 80.0–100.0)
Monocytes Absolute: 0.5 10*3/uL (ref 0.1–1.0)
Monocytes Relative: 8 %
Neutro Abs: 2.6 10*3/uL (ref 1.7–7.7)
Neutrophils Relative %: 44 %
Platelet Count: 328 10*3/uL (ref 150–400)
RBC: 4.62 MIL/uL (ref 3.87–5.11)
RDW: 14.1 % (ref 11.5–15.5)
WBC Count: 5.8 10*3/uL (ref 4.0–10.5)
nRBC: 0 % (ref 0.0–0.2)

## 2022-11-25 LAB — CMP (CANCER CENTER ONLY)
ALT: 45 U/L — ABNORMAL HIGH (ref 0–44)
AST: 35 U/L (ref 15–41)
Albumin: 4.3 g/dL (ref 3.5–5.0)
Alkaline Phosphatase: 129 U/L — ABNORMAL HIGH (ref 38–126)
Anion gap: 9 (ref 5–15)
BUN: 15 mg/dL (ref 6–20)
CO2: 23 mmol/L (ref 22–32)
Calcium: 9.6 mg/dL (ref 8.9–10.3)
Chloride: 105 mmol/L (ref 98–111)
Creatinine: 0.74 mg/dL (ref 0.44–1.00)
GFR, Estimated: >60 mL/min (ref >60)
Glucose, Bld: 115 mg/dL — ABNORMAL HIGH (ref 70–99)
Potassium: 4 mmol/L (ref 3.5–5.1)
Sodium: 137 mmol/L (ref 135–145)
Total Bilirubin: 0.5 mg/dL (ref 0.3–1.2)
Total Protein: 7.7 g/dL (ref 6.5–8.1)

## 2022-11-25 NOTE — Assessment & Plan Note (Signed)
Likely due to previous alcohol use. Level has improved, She has stopped drinking alcohol for 4 months.

## 2022-11-25 NOTE — Progress Notes (Signed)
Hematology/Oncology Consult Note [phone] [phone]     REFERRING PROVIDER: Alm Bustard, NP    CHIEF COMPLAINTS/PURPOSE OF CONSULTATION:  Right breast DCIS with microinvasive carcinoma.   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  High grade DCIS with microinvasive carcinoma.  Recommend upfront resection. I discussed with pathologist, ER/PR/HER2 will be performed on final pathology specimen. Current biopsy specimen is small and results may not be reliable. In addition these results will not change her management.   She will establish care with Dr.Sakai for evaluation for surgical resection. Discussed with Dr. Tonna Boehringer, he recommends upfront mastectomy w SLNB due to multifocal and span of areas of concern.  Further adjuvant plan depends on final pathology.   Check labs. Cbc cmp tumor markers.   Goals of care, counseling/discussion Dicussed with patient. Curative intent.   Alkaline phosphatase elevation Likely due to previous alcohol use. Level has improved, She has stopped drinking alcohol for 4 months.    Orders Placed This Encounter  Procedures   Cancer antigen 15-3    Standing Status:   Future    Number of Occurrences:   1    Standing Expiration Date:   11/25/2023   Cancer antigen 27.29    Standing Status:   Future    Number of Occurrences:   1    Standing Expiration Date:   11/25/2023   CBC with Differential (Cancer Center Only)    Standing Status:   Future    Number of Occurrences:   1    Standing Expiration Date:   11/25/2023   CMP (Cancer Center only)    Standing  Status:   Future    Number of Occurrences:   1    Standing Expiration Date:   11/25/2023   Follow up TBD All questions were answered. The patient knows to call the clinic with any problems, questions or concerns.  Rickard Patience, MD, PhD [facility] Hematology Oncology 11/25/2022    HISTORY OF PRESENTING ILLNESS:  Tina Payne 57 y.o. female presents to establish care for breast cancer.  Patient self papulated a mass in right breast. .  I have reviewed her chart and materials related to her cancer extensively and collaborated history with the patient. Summary of oncologic history is as follows: Oncology History  Invasive carcinoma of breast  11/06/2022 Imaging   Patient palpated right breast mass  Bilateral diagnostic mammogram showed  1. At the site of palpable concern in the RIGHT breast, there is an irregular 13 mm mass. Recommend ultrasound-guided biopsy for definitive characterization. 2. Extending medially from this mass is a heterogeneous masslike area spanning 27 mm. Recommend ultrasound-guided biopsy of a portion of this masslike area for definitive characterization. 3. At 3 o'clock 7 cm from the nipple, there is an irregular hypoechoic mass measuring approximately 12 mm in maximum dimension. Recommend ultrasound-guided biopsy for definitive characterization. 4. Mammographically, there is focal asymmetry with associated architectural distortion noted in the region of palpable concern. Recommend attention on post marker placement mammogram to assess for adequate sampling of this area and mammographic/sonographic correlation. 5. No suspicious RIGHT axillary adenopathy. 6. No mammographic evidence  of malignancy in the LEFT breast.   11/19/2022 Initial Diagnosis   Invasive carcinoma of breast  - s/p right breast biopsy on 11/19/2022   A. BREAST, RIGHT AT 3:00, 7 CM FROM NIPPLE; ULTRASOUND-GUIDED CORE NEEDLE BIOPSY (RIBBON CLIP): - DUCTAL CARCINOMA IN SITU, HIGH-GRADE, WITH  COMEDONECROSIS. - NEGATIVE FOR INVASIVE CARCINOMA.   B.BREAST, RIGHT AT 2:00, 4 CM FROM THE NIPPLE, MEDIAL ASPECT;  ULTRASOUND-GUIDED CORE NEEDLE BIOPSY (COIL CLIP):  - MICROINVASIVE MAMMARY CARCINOMA, NO SPECIAL TYPE. Grade 2, high grade DCIS with comedonecrosis  C BREAST, RIGHT AT 2:00, 4 CM FROM THE NIPPLE, LATERAL ASPECT;  ULTRASOUND-GUIDED CORE NEEDLE BIOPSY (VENUS CLIP):  - MICROINVASIVE MAMMARY CARCINOMA, NO SPECIAL TYPE. Grade 2, high grade DCIS with comedonecrosis  Menarche at age of 49 First live birth at age of 71 OCP use: >5 years,  History of hysterectomy: no Menopausal status: menopaused at 14 or 57 yo.  History of HRT use: no  History of chest radiation: no Number of previous breast biopsies:  once more than 30 years ago    11/25/2022 Cancer Staging   Staging form: Breast, AJCC 8th Edition - Clinical stage from 11/25/2022: cT1, cN0, cM0 - Signed by Rickard Patience, MD on 11/25/2022 Stage prefix: Initial diagnosis    Today she presents to establish care.     MEDICAL HISTORY:  Past Medical History:  Diagnosis Date   Invasive carcinoma of breast 11/25/2022    SURGICAL HISTORY: Past Surgical History:  Procedure Laterality Date   BREAST BIOPSY Left    neg in past ? @ 2010   BREAST BIOPSY Left 11/19/2022   site 1,    3:00 7cmfn ribbon marker, path pending   BREAST BIOPSY Left 11/19/2022   site 2,   2:00 4cmfn, coil marker, path pending   BREAST BIOPSY  11/19/2022   site 3, 2:00 4cmfn, venus marker, path pending   BREAST BIOPSY Right 11/19/2022   [place] RT BREAST BX W LOC DEV 1ST LESION IMG BX SPEC US GUIDE 11/19/2022 [facility]   BREAST BIOPSY Right 11/19/2022   [place] RT BREAST BX W LOC DEV EA ADD LESION IMG BX SPEC US GUIDE 11/19/2022 [facility]   BREAST BIOPSY Right 11/19/2022   [place] RT BREAST BX W LOC DEV EA ADD LESION IMG BX SPEC US GUIDE 11/19/2022 [facility]    SOCIAL HISTORY: Social History   Socioeconomic History   Marital status: Married    Spouse name:  Not on file   Number of children: Not on file   Years of education: Not on file   Highest education level: Not on file  Occupational History   Not on file  Tobacco Use   Smoking status: Former    Types: Cigarettes    Quit date: 2020    Years since quitting: 4.2   Smokeless tobacco: Never  Vaping Use   Vaping Use: Never used  Substance and Sexual Activity   Alcohol use: No   Drug use: No   Sexual activity: Not on file  Other Topics Concern   Not on file  Social History Narrative   Not on file   Social Determinants of Health   Financial Resource Strain: Not on file  Food Insecurity: No Food Insecurity (11/25/2022)   Hunger Vital Sign    Worried About Running Out of Food in the Last Year: Never true    Ran Out of Food in the Last Year: Never true  Transportation Needs: No Transportation Needs (11/25/2022)   PRAPARE - Transportation  Lack of Transportation (Medical): No    Lack of Transportation (Non-Medical): No  Physical Activity: Not on file  Stress: Not on file  Social Connections: Not on file  Intimate Partner Violence: Not At Risk (11/25/2022)   Humiliation, Afraid, Rape, and Kick questionnaire    Fear of Current or Ex-Partner: No    Emotionally Abused: No    Physically Abused: No    Sexually Abused: No    FAMILY HISTORY: Family History  Problem Relation Age of Onset   Lung cancer Mother    Diabetes Father    Lung cancer Father    Lung cancer Brother    Breast cancer Neg Hx     ALLERGIES:  has No Known Allergies.  MEDICATIONS:  Current Outpatient Medications  Medication Sig Dispense Refill   acetaminophen (TYLENOL) 650 MG CR tablet Take 650 mg by mouth every 8 (eight) hours as needed for pain.     atorvastatin (LIPITOR) 20 MG tablet Take 20 mg by mouth daily.     Cholecalciferol 50 MCG (2000 UT) TABS Take by mouth.     levothyroxine (SYNTHROID) 75 MCG tablet Take by mouth.     metFORMIN (GLUCOPHAGE-XR) 500 MG 24 hr tablet Take by mouth.     TRULICITY 4.5  MG/0.5ML SOPN SMARTSIG:0.5 Milliliter(s) SUB-Q Once a Week     No current facility-administered medications for this visit.    Review of Systems  Constitutional:  Negative for appetite change, chills, fatigue and fever.  HENT:   Negative for hearing loss and voice change.   Eyes:  Negative for eye problems.  Respiratory:  Negative for chest tightness and cough.   Cardiovascular:  Negative for chest pain.  Gastrointestinal:  Negative for abdominal distention, abdominal pain and blood in stool.  Endocrine: Negative for hot flashes.  Genitourinary:  Negative for difficulty urinating and frequency.   Musculoskeletal:  Negative for arthralgias.  Skin:  Negative for itching and rash.  Neurological:  Negative for extremity weakness.  Hematological:  Negative for adenopathy.  Psychiatric/Behavioral:  Negative for confusion.      PHYSICAL EXAMINATION: ECOG PERFORMANCE STATUS: 0 - Asymptomatic  Vitals:   11/25/22 0946  BP: 110/80  Pulse: 72  Resp: 17  Temp: (!) 97 F (36.1 C)  SpO2: 100%   Filed Weights   11/25/22 0946  Weight: 189 lb (85.7 kg)    Physical Exam Constitutional:      General: She is not in acute distress.    Appearance: She is not diaphoretic.  HENT:     Head: Normocephalic and atraumatic.     Nose: Nose normal.     Mouth/Throat:     Pharynx: No oropharyngeal exudate.  Eyes:     General: No scleral icterus.    Pupils: Pupils are equal, round, and reactive to light.  Cardiovascular:     Rate and Rhythm: Normal rate and regular rhythm.     Heart sounds: No murmur heard. Pulmonary:     Effort: Pulmonary effort is normal. No respiratory distress.     Breath sounds: No rales.  Chest:     Chest wall: No tenderness.  Abdominal:     General: There is no distension.     Palpations: Abdomen is soft.     Tenderness: There is no abdominal tenderness.  Musculoskeletal:        General: Normal range of motion.     Cervical back: Normal range of motion and neck  supple.  Skin:    General: Skin  is warm and dry.     Findings: No erythema.  Neurological:     Mental Status: She is alert and oriented to person, place, and time.     Cranial Nerves: No cranial nerve deficit.     Motor: No abnormal muscle tone.     Coordination: Coordination normal.  Psychiatric:        Mood and Affect: Affect normal.   Breast exam was performed in seated and lying down position. Patient is status post right breast biopsy. Palpable firm right upper inner quadrant mass, as well additional mass like tissue medially to the mass. No palpable mass in left breast.  No palpable axillary adenopathy bilaterally.   LABORATORY DATA:  I have reviewed the data as listed    Latest Ref Rng & Units 11/25/2022   10:48 AM 03/03/2013    5:59 AM 03/02/2013   12:40 AM  CBC  WBC 4.0 - 10.5 K/uL 5.8  6.4  8.3   Hemoglobin 12.0 - 15.0 g/dL 40.9  81.1  91.4   Hematocrit 36.0 - 46.0 % 38.0  38.1  39.4   Platelets 150 - 400 K/uL 328  229  237       Latest Ref Rng & Units 11/25/2022   10:48 AM 03/03/2013    5:59 AM 03/02/2013   12:40 AM  CMP  Glucose 70 - 99 mg/dL 782  91  956   BUN 6 - 20 mg/dL 15  9  10    Creatinine 0.44 - 1.00 mg/dL 2.13  0.86  5.78   Sodium 135 - 145 mmol/L 137  142  138   Potassium 3.5 - 5.1 mmol/L 4.0  4.1  3.5   Chloride 98 - 111 mmol/L 105  109  104   CO2 22 - 32 mmol/L 23  27  31    Calcium 8.9 - 10.3 mg/dL 9.6  9.0  9.0   Total Protein 6.5 - 8.1 g/dL 7.7  6.5  7.2   Total Bilirubin 0.3 - 1.2 mg/dL 0.5  0.4  0.2   Alkaline Phos 38 - 126 U/L 129  99  110   AST 15 - 41 U/L 35  30  29   ALT 0 - 44 U/L 45  37  36      RADIOGRAPHIC STUDIES: I have personally reviewed the radiological images as listed and agreed with the findings in the report. [place] RT BREAST BX W LOC DEV 1ST LESION IMG BX SPEC US GUIDE  Addendum Date: 11/24/2022   ADDENDUM REPORT: 11/24/2022 11:09 ADDENDUM: PATHOLOGY revealed: Site A. BREAST, RIGHT AT 3:00, 7 CM FROM NIPPLE; ULTRASOUND-GUIDED CORE  NEEDLE BIOPSY - (RIBBON CLIP): - DUCTAL CARCINOMA IN SITU, HIGH-GRADE, WITH COMEDONECROSIS. - NEGATIVE FOR INVASIVE CARCINOMA. Comment: DCIS is present in 2 of 2 tissue blocks, measuring up to 6 mm in greatest linear extent. Pathology results are CONCORDANT with imaging findings, per Dr. Meda Klinefelter. PATHOLOGY revealed: Site B. BREAST, RIGHT AT 2:00, 4 CM FROM THE NIPPLE, MEDIAL ASPECT; ULTRASOUND-GUIDED CORE NEEDLE BIOPSY (COIL CLIP): - MICROINVASIVE MAMMARY CARCINOMA, NO SPECIAL TYPE. 0.3 mm in this sample. Grade at least 2. Ductal carcinoma in situ: Present, high-grade with comedonecrosis. Lymphovascular invasion: Not identified. Pathology results are CONCORDANT with imaging findings, per Dr. Meda Klinefelter. PATHOLOGY revealed: Site C. BREAST, RIGHT AT 2:00, 4 CM FROM THE NIPPLE, LATERAL ASPECT; ULTRASOUND-GUIDED CORE NEEDLE BIOPSY (VENUS CLIP): - MICROINVASIVE MAMMARY CARCINOMA, NO SPECIAL TYPE. 0.2 mm in this sample. Grade at least 2. Ductal carcinoma  in situ: Present, high-grade with comedonecrosis. Lymphovascular invasion: Not identified. COMMENT: On initial evaluation, there were focal areas in each specimen that appeared suspicious for microinvasive carcinoma, in a background of extensive high grade ductal carcinoma in situ. Immunohistochemical studies directed against myoepithelial markers p63 and calponin were performed on selected blocks for each source. Source A demonstrates intact myoepithelial staining, compatible with DCIS. However, in sources B and C, there are focal areas demonstrating absence of myoepithelial marker, compatible with microinvasive carcinoma. In addition, in part C, there is an area of disrupted DCIS without definite myoepithelial marking, however, DCIS is still favored, with lack of staining secondary to tissue disruption. Pathology results are CONCORDANT with imaging findings, per Dr. Meda Klinefelter. Pathology results and recommendations below were discussed with  patient by telephone on 11/21/2022. Patient reported biopsy site within normal limits with slight tenderness at the site. Post biopsy care instructions were reviewed, questions were answered and my direct phone number was provided to patient. Patient was instructed to call [facility] [address] if any concerns or questions arise related to the biopsy. RECOMMENDATIONS: 1. Surgical and oncological consultation. Request for surgical and oncological consultation relayed to Irving Shows RN at [facility] by Randa Lynn RN on 11/21/2022. 2. Recommend pretreatment bilateral breast MRI with and without contrast to evaluate extent of breast disease. NOTE: Biopsy clips span at least 4.1 cm. Pathology results reported by Randa Lynn RN on 11/24/2022. Electronically Signed   By: Meda Klinefelter M.D.   On: 11/24/2022 11:09   Result Date: 11/24/2022 CLINICAL DATA:  Patient presented with a palpable area in the RIGHT breast. On diagnostic mammogram, there is a focal asymmetry with subtle architectural distortion noted in this area of palpable concern. Ill-defined dominant mass identified at 2 o'clock 4 cm from the nipple. Separate mass identified at 3 o'clock 7 cm from the nipple. Patient presents for 3 site ultrasound-guided biopsy for sampling. EXAM: ULTRASOUND GUIDED RIGHT BREAST CORE NEEDLE BIOPSY x3 COMPARISON:  Previous exam(s). PROCEDURE: I met with the patient and we discussed the procedure of ultrasound-guided biopsy, including benefits and alternatives. We discussed the high likelihood of a successful procedure. We discussed the risks of the procedure, including infection, bleeding, tissue injury, clip migration, and inadequate sampling. Informed written consent was given. The usual time-out protocol was performed immediately prior to the procedure. Site 1: 3 o'clock 7 cm from the nipple Lesion quadrant: Upper inner quadrant Using sterile technique and 2% Nesacaine as local anesthetic, under direct  ultrasound visualization, a 14 gauge spring-loaded device was used to perform biopsy of a mass at 3 o'clock 7 cm from the nipple using a medial approach. At the conclusion of the procedure a RIBBON shaped tissue marker clip was deployed into the biopsy cavity. Follow up 2 view mammogram was performed and dictated separately. Site 2: Medial aspect of 2 o'clock 4 cm from the nipple Lesion quadrant: Upper inner quadrant Using sterile technique and 2% Nesacaine as local anesthetic, under direct ultrasound visualization, a 14 gauge spring-loaded device was used to perform biopsy of medial aspect of ill-defined mass at 2 o'clock 4 cm from the nipple using a medial approach. At the conclusion of the procedure a COIL shaped tissue marker clip was deployed into the biopsy cavity. Follow up 2 view mammogram was performed and dictated separately. Site 3: Lateral aspect 2 o'clock 4 cm from the nipple Lesion quadrant: Upper inner quadrant Using sterile technique and 2% Nesacaine as local anesthetic, under direct ultrasound visualization, a  14 gauge spring-loaded device was used to perform biopsy of the lateral aspect of an ill-defined mass at 2 o'clock 4 cm from the nipple using a medial approach. At the conclusion of the procedure a venus shaped tissue marker clip was deployed into the biopsy cavity. Follow up 2 view mammogram was performed and dictated separately. IMPRESSION: Three site RIGHT breast ultrasound-guided biopsy. No apparent complications. Electronically Signed: By: Meda Klinefelter M.D. On: 11/19/2022 14:33   [place] RT BREAST BX W LOC DEV EA ADD LESION IMG BX SPEC US GUIDE  Addendum Date: 11/24/2022   ADDENDUM REPORT: 11/24/2022 11:09 ADDENDUM: PATHOLOGY revealed: Site A. BREAST, RIGHT AT 3:00, 7 CM FROM NIPPLE; ULTRASOUND-GUIDED CORE NEEDLE BIOPSY - (RIBBON CLIP): - DUCTAL CARCINOMA IN SITU, HIGH-GRADE, WITH COMEDONECROSIS. - NEGATIVE FOR INVASIVE CARCINOMA. Comment: DCIS is present in 2 of 2 tissue blocks,  measuring up to 6 mm in greatest linear extent. Pathology results are CONCORDANT with imaging findings, per Dr. Meda Klinefelter. PATHOLOGY revealed: Site B. BREAST, RIGHT AT 2:00, 4 CM FROM THE NIPPLE, MEDIAL ASPECT; ULTRASOUND-GUIDED CORE NEEDLE BIOPSY (COIL CLIP): - MICROINVASIVE MAMMARY CARCINOMA, NO SPECIAL TYPE. 0.3 mm in this sample. Grade at least 2. Ductal carcinoma in situ: Present, high-grade with comedonecrosis. Lymphovascular invasion: Not identified. Pathology results are CONCORDANT with imaging findings, per Dr. Meda Klinefelter. PATHOLOGY revealed: Site C. BREAST, RIGHT AT 2:00, 4 CM FROM THE NIPPLE, LATERAL ASPECT; ULTRASOUND-GUIDED CORE NEEDLE BIOPSY (VENUS CLIP): - MICROINVASIVE MAMMARY CARCINOMA, NO SPECIAL TYPE. 0.2 mm in this sample. Grade at least 2. Ductal carcinoma in situ: Present, high-grade with comedonecrosis. Lymphovascular invasion: Not identified. COMMENT: On initial evaluation, there were focal areas in each specimen that appeared suspicious for microinvasive carcinoma, in a background of extensive high grade ductal carcinoma in situ. Immunohistochemical studies directed against myoepithelial markers p63 and calponin were performed on selected blocks for each source. Source A demonstrates intact myoepithelial staining, compatible with DCIS. However, in sources B and C, there are focal areas demonstrating absence of myoepithelial marker, compatible with microinvasive carcinoma. In addition, in part C, there is an area of disrupted DCIS without definite myoepithelial marking, however, DCIS is still favored, with lack of staining secondary to tissue disruption. Pathology results are CONCORDANT with imaging findings, per Dr. Meda Klinefelter. Pathology results and recommendations below were discussed with patient by telephone on 11/21/2022. Patient reported biopsy site within normal limits with slight tenderness at the site. Post biopsy care instructions were reviewed, questions were  answered and my direct phone number was provided to patient. Patient was instructed to call [facility] if any concerns or questions arise related to the biopsy. RECOMMENDATIONS: 1. Surgical and oncological consultation. Request for surgical and oncological consultation relayed to Irving Shows RN at [facility] by Randa Lynn RN on 11/21/2022. 2. Recommend pretreatment bilateral breast MRI with and without contrast to evaluate extent of breast disease. NOTE: Biopsy clips span at least 4.1 cm. Pathology results reported by Randa Lynn RN on 11/24/2022. Electronically Signed   By: Meda Klinefelter M.D.   On: 11/24/2022 11:09   Result Date: 11/24/2022 CLINICAL DATA:  Patient presented with a palpable area in the RIGHT breast. On diagnostic mammogram, there is a focal asymmetry with subtle architectural distortion noted in this area of palpable concern. Ill-defined dominant mass identified at 2 o'clock 4 cm from the nipple. Separate mass identified at 3 o'clock 7 cm from the nipple. Patient presents for 3 site ultrasound-guided biopsy for sampling. EXAM: ULTRASOUND GUIDED  RIGHT BREAST CORE NEEDLE BIOPSY x3 COMPARISON:  Previous exam(s). PROCEDURE: I met with the patient and we discussed the procedure of ultrasound-guided biopsy, including benefits and alternatives. We discussed the high likelihood of a successful procedure. We discussed the risks of the procedure, including infection, bleeding, tissue injury, clip migration, and inadequate sampling. Informed written consent was given. The usual time-out protocol was performed immediately prior to the procedure. Site 1: 3 o'clock 7 cm from the nipple Lesion quadrant: Upper inner quadrant Using sterile technique and 2% Nesacaine as local anesthetic, under direct ultrasound visualization, a 14 gauge spring-loaded device was used to perform biopsy of a mass at 3 o'clock 7 cm from the nipple using a medial approach. At the conclusion of the procedure  a RIBBON shaped tissue marker clip was deployed into the biopsy cavity. Follow up 2 view mammogram was performed and dictated separately. Site 2: Medial aspect of 2 o'clock 4 cm from the nipple Lesion quadrant: Upper inner quadrant Using sterile technique and 2% Nesacaine as local anesthetic, under direct ultrasound visualization, a 14 gauge spring-loaded device was used to perform biopsy of medial aspect of ill-defined mass at 2 o'clock 4 cm from the nipple using a medial approach. At the conclusion of the procedure a COIL shaped tissue marker clip was deployed into the biopsy cavity. Follow up 2 view mammogram was performed and dictated separately. Site 3: Lateral aspect 2 o'clock 4 cm from the nipple Lesion quadrant: Upper inner quadrant Using sterile technique and 2% Nesacaine as local anesthetic, under direct ultrasound visualization, a 14 gauge spring-loaded device was used to perform biopsy of the lateral aspect of an ill-defined mass at 2 o'clock 4 cm from the nipple using a medial approach. At the conclusion of the procedure a venus shaped tissue marker clip was deployed into the biopsy cavity. Follow up 2 view mammogram was performed and dictated separately. IMPRESSION: Three site RIGHT breast ultrasound-guided biopsy. No apparent complications. Electronically Signed: By: Meda Klinefelter M.D. On: 11/19/2022 14:33   [place] RT BREAST BX W LOC DEV EA ADD LESION IMG BX SPEC US GUIDE  Addendum Date: 11/24/2022   ADDENDUM REPORT: 11/24/2022 11:09 ADDENDUM: PATHOLOGY revealed: Site A. BREAST, RIGHT AT 3:00, 7 CM FROM NIPPLE; ULTRASOUND-GUIDED CORE NEEDLE BIOPSY - (RIBBON CLIP): - DUCTAL CARCINOMA IN SITU, HIGH-GRADE, WITH COMEDONECROSIS. - NEGATIVE FOR INVASIVE CARCINOMA. Comment: DCIS is present in 2 of 2 tissue blocks, measuring up to 6 mm in greatest linear extent. Pathology results are CONCORDANT with imaging findings, per Dr. Meda Klinefelter. PATHOLOGY revealed: Site B. BREAST, RIGHT AT 2:00, 4 CM FROM  THE NIPPLE, MEDIAL ASPECT; ULTRASOUND-GUIDED CORE NEEDLE BIOPSY (COIL CLIP): - MICROINVASIVE MAMMARY CARCINOMA, NO SPECIAL TYPE. 0.3 mm in this sample. Grade at least 2. Ductal carcinoma in situ: Present, high-grade with comedonecrosis. Lymphovascular invasion: Not identified. Pathology results are CONCORDANT with imaging findings, per Dr. Meda Klinefelter. PATHOLOGY revealed: Site C. BREAST, RIGHT AT 2:00, 4 CM FROM THE NIPPLE, LATERAL ASPECT; ULTRASOUND-GUIDED CORE NEEDLE BIOPSY (VENUS CLIP): - MICROINVASIVE MAMMARY CARCINOMA, NO SPECIAL TYPE. 0.2 mm in this sample. Grade at least 2. Ductal carcinoma in situ: Present, high-grade with comedonecrosis. Lymphovascular invasion: Not identified. COMMENT: On initial evaluation, there were focal areas in each specimen that appeared suspicious for microinvasive carcinoma, in a background of extensive high grade ductal carcinoma in situ. Immunohistochemical studies directed against myoepithelial markers p63 and calponin were performed on selected blocks for each source. Source A demonstrates intact myoepithelial staining, compatible with DCIS. However, in  sources B and C, there are focal areas demonstrating absence of myoepithelial marker, compatible with microinvasive carcinoma. In addition, in part C, there is an area of disrupted DCIS without definite myoepithelial marking, however, DCIS is still favored, with lack of staining secondary to tissue disruption. Pathology results are CONCORDANT with imaging findings, per Dr. Meda Klinefelter. Pathology results and recommendations below were discussed with patient by telephone on 11/21/2022. Patient reported biopsy site within normal limits with slight tenderness at the site. Post biopsy care instructions were reviewed, questions were answered and my direct phone number was provided to patient. Patient was instructed to call [facility] if any concerns or questions arise related to the biopsy. RECOMMENDATIONS: 1.  Surgical and oncological consultation. Request for surgical and oncological consultation relayed to Irving Shows RN at [facility] by Randa Lynn RN on 11/21/2022. 2. Recommend pretreatment bilateral breast MRI with and without contrast to evaluate extent of breast disease. NOTE: Biopsy clips span at least 4.1 cm. Pathology results reported by Randa Lynn RN on 11/24/2022. Electronically Signed   By: Meda Klinefelter M.D.   On: 11/24/2022 11:09   Result Date: 11/24/2022 CLINICAL DATA:  Patient presented with a palpable area in the RIGHT breast. On diagnostic mammogram, there is a focal asymmetry with subtle architectural distortion noted in this area of palpable concern. Ill-defined dominant mass identified at 2 o'clock 4 cm from the nipple. Separate mass identified at 3 o'clock 7 cm from the nipple. Patient presents for 3 site ultrasound-guided biopsy for sampling. EXAM: ULTRASOUND GUIDED RIGHT BREAST CORE NEEDLE BIOPSY x3 COMPARISON:  Previous exam(s). PROCEDURE: I met with the patient and we discussed the procedure of ultrasound-guided biopsy, including benefits and alternatives. We discussed the high likelihood of a successful procedure. We discussed the risks of the procedure, including infection, bleeding, tissue injury, clip migration, and inadequate sampling. Informed written consent was given. The usual time-out protocol was performed immediately prior to the procedure. Site 1: 3 o'clock 7 cm from the nipple Lesion quadrant: Upper inner quadrant Using sterile technique and 2% Nesacaine as local anesthetic, under direct ultrasound visualization, a 14 gauge spring-loaded device was used to perform biopsy of a mass at 3 o'clock 7 cm from the nipple using a medial approach. At the conclusion of the procedure a RIBBON shaped tissue marker clip was deployed into the biopsy cavity. Follow up 2 view mammogram was performed and dictated separately. Site 2: Medial aspect of 2 o'clock 4 cm from the  nipple Lesion quadrant: Upper inner quadrant Using sterile technique and 2% Nesacaine as local anesthetic, under direct ultrasound visualization, a 14 gauge spring-loaded device was used to perform biopsy of medial aspect of ill-defined mass at 2 o'clock 4 cm from the nipple using a medial approach. At the conclusion of the procedure a COIL shaped tissue marker clip was deployed into the biopsy cavity. Follow up 2 view mammogram was performed and dictated separately. Site 3: Lateral aspect 2 o'clock 4 cm from the nipple Lesion quadrant: Upper inner quadrant Using sterile technique and 2% Nesacaine as local anesthetic, under direct ultrasound visualization, a 14 gauge spring-loaded device was used to perform biopsy of the lateral aspect of an ill-defined mass at 2 o'clock 4 cm from the nipple using a medial approach. At the conclusion of the procedure a venus shaped tissue marker clip was deployed into the biopsy cavity. Follow up 2 view mammogram was performed and dictated separately. IMPRESSION: Three site RIGHT breast ultrasound-guided biopsy. No apparent  complications. Electronically Signed: By: Meda Klinefelter M.D. On: 11/19/2022 14:33   MM CLIP PLACEMENT RIGHT  Result Date: 11/19/2022 CLINICAL DATA:  Status post 3 site ultrasound-guided biopsy EXAM: 3D DIAGNOSTIC RIGHT MAMMOGRAM POST ULTRASOUND BIOPSY COMPARISON:  Previous exam(s). FINDINGS: Site 1: 3D Mammographic images were obtained following ultrasound guided biopsy of a sonographically identified mass at 3 o'clock 7 cm from the nipple. The RIBBON biopsy marking clip is in expected position at the site of biopsy. Site 2: 3D Mammographic images were obtained following ultrasound guided biopsy of the medial aspect of the mass at 2 o'clock 4 cm from the nipple. The COIL biopsy marking clip is in expected position at the site of biopsy. This clip falls at the site of asymmetry/distortion noted on true lateral imaging. Site 3: 3D Mammographic images were  obtained following ultrasound guided biopsy of the lateral aspect of the mass at 2 o'clock 4 cm from the nipple. The venus biopsy marking clip is in expected position at the site of biopsy. The clip falls at the site of asymmetry with distortion noted on CC imaging. Site 1 and 3 are approximately 4.1 cm apart. IMPRESSION: 1. Appropriate positioning of the RIBBON shaped biopsy marking clip at the site of biopsy in the inner breast at posterior depth. 2. Appropriate positioning of the COIL shaped biopsy marking clip at the site of biopsy in the upper inner breast. 3. Appropriate positioning of the venus shaped biopsy marking clip at the site of biopsy in the upper inner breast. 4. Clips span approximately 4.1 cm. Final Assessment: Post Procedure Mammograms for Marker Placement Electronically Signed   By: Meda Klinefelter M.D.   On: 11/19/2022 14:27  MM 3D DIAGNOSTIC MAMMOGRAM BILATERAL BREAST  Result Date: 11/06/2022 CLINICAL DATA:  Palpable area in the RIGHT breast for 3 weeks. EXAM: DIGITAL DIAGNOSTIC BILATERAL MAMMOGRAM WITH TOMOSYNTHESIS; ULTRASOUND RIGHT BREAST LIMITED TECHNIQUE: Bilateral digital diagnostic mammography and breast tomosynthesis was performed.; Targeted ultrasound examination of the right breast was performed COMPARISON:  Previous exam(s). ACR Breast Density Category c: The breasts are heterogeneously dense, which may obscure small masses. FINDINGS: In the RIGHT inner breast posterior depth, there is a focal asymmetry with associated architectural distortion. It is best seen on spot CC slice 32, ML slice 29 spot MLO volume 1 slice 29, MLO slice 37. Spot compression tomosynthesis views were obtained of the site of palpable concern. This focal asymmetry is noted in the region of the site of palpable concern. An additional questioned asymmetry in the upper breast resolves with additional views, consistent with overlapping tissue. A questioned asymmetry in the LEFT breast resolves with  additional views, consistent with overlapping tissue. No suspicious mass, distortion, or microcalcifications are identified to suggest presence of malignancy in the LEFT breast. On physical exam, there is a firm tissue at the site of palpable concern in the RIGHT upper inner breast. Targeted ultrasound was performed of the site of palpable concern in the RIGHT breast. Finding 1: At 2 o'clock 4 cm from the nipple, there is an irregular hypoechoic mass with indistinct margins. It measures 9 by 13 x 8 mm. Finding 2: Extending medially from finding 1 is a heterogeneous masslike area consisting of multiple hypoechoic tubular appearing areas with intermittent more focal nodular areas. This area spans approximately 27 x 25 by 11 mm. More focal nodular area along the medial margin measures approximately 15 mm. Finding 3: At 3 o'clock 7 cm from the nipple, there is an irregular hypoechoic mass. It is  estimated to measure 6 x 5 by 12 mm with a more focal nodular medial portion measuring 6 x 5 x 6 mm. This is approximately 18 mm inferomedial from the margin of finding 2. Targeted ultrasound was performed of the RIGHT axilla. No suspicious axillary lymph nodes are visualized. IMPRESSION: 1. At the site of palpable concern in the RIGHT breast, there is an irregular 13 mm mass. Recommend ultrasound-guided biopsy for definitive characterization. 2. Extending medially from this mass is a heterogeneous masslike area spanning 27 mm. Recommend ultrasound-guided biopsy of a portion of this masslike area for definitive characterization. 3. At 3 o'clock 7 cm from the nipple, there is an irregular hypoechoic mass measuring approximately 12 mm in maximum dimension. Recommend ultrasound-guided biopsy for definitive characterization. 4. Mammographically, there is focal asymmetry with associated architectural distortion noted in the region of palpable concern. Recommend attention on post marker placement mammogram to assess for adequate  sampling of this area and mammographic/sonographic correlation. 5. No suspicious RIGHT axillary adenopathy. 6. No mammographic evidence of malignancy in the LEFT breast. RECOMMENDATION: RIGHT breast ultrasound-guided biopsy x3 RIGHT breast stereotactic guided biopsy may be necessary if asymmetry with architectural distortion is not adequately sampled and could be performed at a later date dependent on biopsy results. I have discussed the findings and recommendations with the patient. The biopsy procedure was discussed with the patient and questions were answered. Patient expressed their understanding of the biopsy recommendation. Patient will be scheduled for biopsy at her earliest convenience by the schedulers. Ordering provider will be notified. If applicable, a reminder letter will be sent to the patient regarding the next appointment. BI-RADS CATEGORY  4: Suspicious. Electronically Signed   By: Meda Klinefelter M.D.   On: 11/06/2022 12:42  [place] LIMITED ULTRASOUND INCLUDING AXILLA RIGHT BREAST  Result Date: 11/06/2022 CLINICAL DATA:  Palpable area in the RIGHT breast for 3 weeks. EXAM: DIGITAL DIAGNOSTIC BILATERAL MAMMOGRAM WITH TOMOSYNTHESIS; ULTRASOUND RIGHT BREAST LIMITED TECHNIQUE: Bilateral digital diagnostic mammography and breast tomosynthesis was performed.; Targeted ultrasound examination of the right breast was performed COMPARISON:  Previous exam(s). ACR Breast Density Category c: The breasts are heterogeneously dense, which may obscure small masses. FINDINGS: In the RIGHT inner breast posterior depth, there is a focal asymmetry with associated architectural distortion. It is best seen on spot CC slice 32, ML slice 29 spot MLO volume 1 slice 29, MLO slice 37. Spot compression tomosynthesis views were obtained of the site of palpable concern. This focal asymmetry is noted in the region of the site of palpable concern. An additional questioned asymmetry in the upper breast resolves with additional  views, consistent with overlapping tissue. A questioned asymmetry in the LEFT breast resolves with additional views, consistent with overlapping tissue. No suspicious mass, distortion, or microcalcifications are identified to suggest presence of malignancy in the LEFT breast. On physical exam, there is a firm tissue at the site of palpable concern in the RIGHT upper inner breast. Targeted ultrasound was performed of the site of palpable concern in the RIGHT breast. Finding 1: At 2 o'clock 4 cm from the nipple, there is an irregular hypoechoic mass with indistinct margins. It measures 9 by 13 x 8 mm. Finding 2: Extending medially from finding 1 is a heterogeneous masslike area consisting of multiple hypoechoic tubular appearing areas with intermittent more focal nodular areas. This area spans approximately 27 x 25 by 11 mm. More focal nodular area along the medial margin measures approximately 15 mm. Finding 3: At 3 o'clock 7 cm  from the nipple, there is an irregular hypoechoic mass. It is estimated to measure 6 x 5 by 12 mm with a more focal nodular medial portion measuring 6 x 5 x 6 mm. This is approximately 18 mm inferomedial from the margin of finding 2. Targeted ultrasound was performed of the RIGHT axilla. No suspicious axillary lymph nodes are visualized. IMPRESSION: 1. At the site of palpable concern in the RIGHT breast, there is an irregular 13 mm mass. Recommend ultrasound-guided biopsy for definitive characterization. 2. Extending medially from this mass is a heterogeneous masslike area spanning 27 mm. Recommend ultrasound-guided biopsy of a portion of this masslike area for definitive characterization. 3. At 3 o'clock 7 cm from the nipple, there is an irregular hypoechoic mass measuring approximately 12 mm in maximum dimension. Recommend ultrasound-guided biopsy for definitive characterization. 4. Mammographically, there is focal asymmetry with associated architectural distortion noted in the region of  palpable concern. Recommend attention on post marker placement mammogram to assess for adequate sampling of this area and mammographic/sonographic correlation. 5. No suspicious RIGHT axillary adenopathy. 6. No mammographic evidence of malignancy in the LEFT breast. RECOMMENDATION: RIGHT breast ultrasound-guided biopsy x3 RIGHT breast stereotactic guided biopsy may be necessary if asymmetry with architectural distortion is not adequately sampled and could be performed at a later date dependent on biopsy results. I have discussed the findings and recommendations with the patient. The biopsy procedure was discussed with the patient and questions were answered. Patient expressed their understanding of the biopsy recommendation. Patient will be scheduled for biopsy at her earliest convenience by the schedulers. Ordering provider will be notified. If applicable, a reminder letter will be sent to the patient regarding the next appointment. BI-RADS CATEGORY  4: Suspicious. Electronically Signed   By: Meda Klinefelter M.D.   On: 11/06/2022 12:42

## 2022-11-25 NOTE — Assessment & Plan Note (Addendum)
Pathology and imaging findings were reviewed with patient.  High grade DCIS with microinvasive carcinoma.  Recommend upfront resection. I discussed with pathologist, ER/PR/HER2 will be performed on final pathology specimen. Current biopsy specimen is small and results may not be reliable. In addition these results will not change her management.   She will establish care with Dr.Sakai for evaluation for surgical resection. Discussed with Dr. Tonna Boehringer, he recommends upfront mastectomy w SLNB due to multifocal and span of areas of concern.  Further adjuvant plan depends on final pathology.   Check labs. Cbc cmp tumor markers.

## 2022-11-25 NOTE — H&P (Unsigned)
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 female who was referred by [name], NP for evaluation of above.   Noted palpable mass.  Subsequent mammo and biopsy results as noted below.  Already seen oncology.  Past Medical History:  has a past medical history of Alcohol abuse, Colon polyp, Diabetes (CMS/HHS-HCC), GERD (gastroesophageal reflux disease), Hypertension, Hypothyroidism, Smoking, and Type 2 diabetes mellitus (CMS/HHS-HCC).  Past Surgical History:  has a past surgical history that includes colonoscopy (10/25/2009) and Right salpingectomy (1984).  Family History: family history includes Cancer in her brother; Lung cancer in her father and mother.  Social History:  reports that she has quit smoking. Her smoking use included cigarettes. She has a 16 pack-year smoking history. She has quit using smokeless tobacco. She reports current alcohol use. She reports that she does not use drugs.  Current Medications: has a current medication list which includes the following prescription(s): atorvastatin, cholecalciferol, dulaglutide, levothyroxine, metformin, blood glucose diagnostic, blood glucose meter, lancing device with lancets, and lidocaine-prilocaine.  Allergies:  Allergies as of 11/25/2022   (No Known Allergies)    ROS:  A 15 point review of systems was performed and was negative except as noted in HPI   Objective:     BP 113/71   Pulse 76   Ht 170.2 cm (5\' 7" )   Wt 85.7 kg (189 lb)   BMI 29.60 kg/m   Constitutional :  No distress, cooperative, alert  Lymphatics/Throat:  Supple with no lymphadenopathy  Respiratory:  Clear to auscultation bilaterally  Cardiovascular:  Regular rate and rhythm  Gastrointestinal: Soft, non-tender, non-distended, no organomegaly.  Musculoskeletal: Steady gait and movement  Skin: Cool and moist   Psychiatric: Normal affect, non-agitated, not confused  Breast: Normal appearance and no palpable abnormality in bilateral breasts and axilla.  Chaperone present for exam.      LABS: SURGICAL PATHOLOGY SURGICAL PATHOLOGY CASE: [id number] PATIENT: Tina Payne Surgical Pathology Report     Specimen Submitted: A. Breast, 3:00, 7 cm B. Breast, right, 2:00, 4 cm C. Breast, right, 2:00, 4 cm FN lateral  Clinical History: Palpable right breast mass.  Multiple ill defined masses in the right upper inner breast, Sonographically identified. Site 1:  distinct from site 2/3.  Site 2 and 3 sampling 2 areas of a 27 mm ill defined area with subtle distortion on mammo.  Site 1: fibroadenomatoid change, malignancy.  Remote benign biopsy. A - Ribbon-shaped clip placed following ultrasound guided biopsy of RIGHT breast at 3 o'clock, 7 cm fn. B - Coil-shaped clip placed following ultrasound guided biopsy of RIGHT breast at 2 o'clock, 4 cm fn, medial aspect. C - Venus-shaped clip placed following ultrasound guided biopsy of RIGHT breast at 2 o'clock, 4 cm fn, lateral aspect.    DIAGNOSIS: A. BREAST, RIGHT AT 3:00, 7 CM FROM NIPPLE; ULTRASOUND-GUIDED CORE NEEDLE BIOPSY (RIBBON CLIP): - DUCTAL CARCINOMA IN SITU, HIGH-GRADE, WITH COMEDONECROSIS. - NEGATIVE FOR INVASIVE CARCINOMA.  Comment: DCIS is present in 2 of 2 tissue blocks, measuring up to 6 mm in greatest linear extent.  B. BREAST, RIGHT AT 2:00, 4 CM FROM THE NIPPLE, MEDIAL ASPECT; ULTRASOUND-GUIDED CORE NEEDLE BIOPSY (COIL CLIP): - MICROINVASIVE MAMMARY CARCINOMA, NO SPECIAL TYPE.  Size of invasive carcinoma: 0.3 mm in this sample Histologic grade of invasive carcinoma: Grade at least 2  Glandular/tubular differentiation score: 2                      Nuclear pleomorphism score: 3                      Mitotic rate score: At least 1                      Total score: At least 6 Ductal carcinoma in situ:  Present, high-grade with comedonecrosis Lymphovascular invasion: Not identified  C. BREAST, RIGHT AT 2:00, 4 CM FROM THE NIPPLE, LATERAL ASPECT; ULTRASOUND-GUIDED CORE NEEDLE BIOPSY (VENUS CLIP): - MICROINVASIVE MAMMARY CARCINOMA, NO SPECIAL TYPE.  Size of invasive carcinoma: 0.2 mm in this sample Histologic grade of invasive carcinoma: Grade at least 2                      Glandular/tubular differentiation score: 2                      Nuclear pleomorphism score: 3                      Mitotic rate score: At least 1                      Total score: At least 6 Ductal carcinoma in situ: Present, high-grade with comedonecrosis Lymphovascular invasion: Not identified  Comment: On initial evaluation, there were focal areas in each specimen that appeared suspicious for microinvasive carcinoma, in a background of extensive high grade ductal carcinoma in situ. Immunohistochemical studies directed against myoepithelial markers p63 and calponin were performed on selected blocks for each source. Source A demonstrates intact myoepithelial staining, compatible with DCIS. However, in sources B and C, there are focal areas demonstrating absence of myoepithelial marker, compatible with microinvasive carcinoma. In addition, in part C, there is an area of disrupted DCIS without definite myoepithelial marking, however, DCIS is still favored, with lack of staining secondary to tissue disruption.  Definitive grading will be assigned on the final resection.  ER/PR/HER2: Immunohistochemistry will be deferred to the final excision, given the limited amount of tumor present  IHC slides were prepared by [facility], [place]. All controls stained appropriately.  This test was developed and its performance characteristics determined by [facility]. It has not been cleared or approved by the [place] Food and Drug Administration. The FDA does not require this test to go through premarket FDA review. This test  is used for clinical purposes. It should not be regarded as investigational or for research. This laboratory is certified under the Clinical Laboratory Improvement Amendments (CLIA) as qualified to perform high complexity clinical laboratory testing.  GROSS DESCRIPTION: A. Labeled: Right breast 3:00 7 cm from nipple Received: Formalin Time/date in fixative: Collected and placed in formalin at 1:34 PM on 11/19/2022 Cold ischemic time: Less than 1 minute Total fixation time: Approximately 6.75 hours Core pieces: 5 cores and 1 additional fragment Size: Range from 0.5-1.7 cm in length and 0.2 cm in diameter Description: Received are cores and a fragment of pink-yellow focally hemorrhagic fibrofatty tissue.  The additional fragment is 0.3 x 0.2 x 0.1 cm in aggregate. Ink color: Green Entirely submitted in cassettes 1-2 with 3 cores in cassette 1 and 2 cores with the remaining fragment in cassette 2.  B. Labeled: Right breast 2:00 4 cm from nipple Received: Formalin Time/date in fixative:  Collected and placed in formalin at 1:44 PM on 11/19/2022 Cold ischemic time: Less than 1 minute Total fixation time: Approximately 6.5 hours Core pieces: 4 cores and multiple additional fragments Size: Range from 0.6-1.4 cm in length and 0.2 cm in diameter Description: Received are cores and fragments of yellow focally hemorrhagic fibrofatty tissue.  The additional fragments are 0.6 x 0.3 x 0.1 cm in aggregate. Ink color: Black Entirely submitted in cassettes 1-2 with 3 cores in cassette 1 and 1 core with the remaining fragments in cassette 2.  C. Labeled: Right breast 2:00 4 cm from nipple Received: Formalin Time/date in fixative: Collected and placed in formalin at 1:50 PM on 11/19/2022 Cold ischemic time: Less than 1 minute Total fixation time: Approximately 6.5 hours Core pieces: 4 Size: Range from 1.4-1.5 cm in length and 0.2 cm in diameter Description: Received are cores of yellow-pink,  focally hemorrhagic, fibrofatty tissue. Ink color: Blue Entirely submitted in cassettes 1-2 with 2 cores in cassette 1 and 2 cores in cassette 2.  RB 11/19/2022   Final Diagnosis performed by Katherine Mantle, MD.   Electronically signed 11/21/2022 10:23:35AM The electronic signature indicates that the named Attending Pathologist has evaluated the specimen Technical component performed at [facility], [address] [place], [place] [address] Lab: [phone] Dir: Jolene Schimke, MD, MMM  Professional component performed at [facility], [facility] [address], [address] [place], [place], [place] [address] Lab: [phone] Dir: Beryle Quant, MD     RADS: Addendum by Glennon Mac, MD on 11/24/2022  1:10 PM EDT  ADDENDUM REPORT: 11/24/2022 11:09   ADDENDUM:  PATHOLOGY revealed: Site A. BREAST, RIGHT AT 3:00, 7 CM FROM NIPPLE;  ULTRASOUND-GUIDED CORE NEEDLE BIOPSY - (RIBBON CLIP): - DUCTAL  CARCINOMA IN SITU, HIGH-GRADE, WITH COMEDONECROSIS. - NEGATIVE FOR  INVASIVE CARCINOMA. Comment: DCIS is present in 2 of 2 tissue  blocks, measuring up to 6 mm in greatest linear extent.   Pathology results are CONCORDANT with imaging findings, per Dr.  Meda Klinefelter.   PATHOLOGY revealed: Site B. BREAST, RIGHT AT 2:00, 4 CM FROM THE  NIPPLE, MEDIAL ASPECT; ULTRASOUND-GUIDED CORE NEEDLE BIOPSY (COIL  CLIP): - MICROINVASIVE MAMMARY CARCINOMA, NO SPECIAL TYPE. 0.3 mm in  this sample. Grade at least 2. Ductal carcinoma in situ: Present,  high-grade with comedonecrosis. Lymphovascular invasion: Not  identified.   Pathology results are CONCORDANT with imaging findings, per Dr.  Meda Klinefelter.   PATHOLOGY revealed: Site C. BREAST, RIGHT AT 2:00, 4 CM FROM THE  NIPPLE, LATERAL ASPECT; ULTRASOUND-GUIDED CORE NEEDLE BIOPSY (VENUS  CLIP): - MICROINVASIVE MAMMARY CARCINOMA, NO SPECIAL TYPE. 0.2 mm in  this sample. Grade at least 2. Ductal carcinoma in situ: Present,  high-grade with  comedonecrosis. Lymphovascular invasion: Not  identified. COMMENT: On initial evaluation, there were focal areas  in each specimen that appeared suspicious for microinvasive  carcinoma, in a background of extensive high grade ductal carcinoma  in situ. Immunohistochemical studies directed against myoepithelial  markers p63 and calponin were performed on selected blocks for each  source. Source A demonstrates intact myoepithelial staining,  compatible with DCIS. However, in sources B and C, there are focal  areas demonstrating absence of myoepithelial marker, compatible with  microinvasive carcinoma. In addition, in part C, there is an area of  disrupted DCIS without definite myoepithelial marking, however, DCIS  is still favored, with lack of staining secondary to tissue  disruption.   Pathology results are CONCORDANT with imaging findings, per Dr.  Meda Klinefelter.  Pathology results and recommendations below were discussed with  patient by telephone on 11/21/2022. Patient reported biopsy site  within normal limits with slight tenderness at the site. Post biopsy  care instructions were reviewed, questions were answered and my  direct phone number was provided to patient. Patient was instructed  to call [facility] if any concerns or questions arise  related to the biopsy.   RECOMMENDATIONS:   1. Surgical and oncological consultation. Request for surgical and  oncological consultation relayed to Irving Shows RN at [facility] by Randa Lynn RN on 11/21/2022.   2. Recommend pretreatment bilateral breast MRI with and without  contrast to evaluate extent of breast disease. NOTE: Biopsy clips  span at least 4.1 cm.   Pathology results reported by Randa Lynn RN on 11/24/2022.    Electronically Signed    By: Meda Klinefelter M.D.    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  Pt specifically requested we minimize risk of additional procedures, so this approach will minimize issues with positive margins.  Plan:     1. 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 The risks of general anesthetic, if used, includes MI, CVA, sudden death or even reaction to anesthetic medications also discussed.  Typical post-op recovery time and possbility of activity restrictions were also discussed.  Alternatives include continued observation.  Benefits include possible symptom relief, pathologic evaluation, and/or curative excision.   The patient verbalized understanding and all questions were answered to the patient's satisfaction.  2. Patient has elected to proceed with surgical treatment. Procedure will be scheduled.  Left mastectomy with SLNB.  To be done after hormone marker and genetic testing completed.  labs/images/medications/previous chart entries reviewed personally and relevant changes/updates noted above.

## 2022-11-25 NOTE — Progress Notes (Signed)
Patient here for new oncology appointment, expresses no complaints or concerns at this time.

## 2022-11-25 NOTE — Progress Notes (Signed)
REFERRING PROVIDER: Rickard Patience, MD [address] [place],  [place] [address]  PRIMARY PROVIDER:  Marguarite Arbour, MD  PRIMARY REASON FOR VISIT:  1. Invasive carcinoma of breast   2. Family history of prostate cancer   3. Family history of lung cancer      HISTORY OF PRESENT ILLNESS:   Ms. Nocito, a 57 y.o. female, was seen for a [place] cancer genetics consultation at the request of Dr. Cathie Hoops due to a personal and family history of cancer.  Ms. Halsey presents to clinic today to discuss the possibility of a hereditary predisposition to cancer, genetic testing, and to further clarify her future cancer risks, as well as potential cancer risks for family members.   CANCER HISTORY:  In 2024, at the age of 18, Ms. 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 Ms. Boyarsky meets with Dr. Cathie Hoops and Dr. Tonna Boehringer today to determine treatment plan.   Oncology History  Invasive carcinoma of breast  11/25/2022 Initial Diagnosis   Invasive carcinoma of breast   11/25/2022 Cancer Staging   Staging form: Breast, AJCC 8th Edition - Clinical stage from 11/25/2022: cT1, cN0, cM0 - Signed by Rickard Patience, MD on 11/25/2022 Stage prefix: Initial diagnosis    RISK FACTORS:  Menarche was at age 61-15.  First live birth at age 48.  Ovaries intact: 1 possibly removed.  Hysterectomy: no.  Menopausal status: postmenopausal.  Colonoscopy: yes;  reports polyps, less than 10 . Mammogram within the last year: yes.  Past Medical History:  Diagnosis Date   Invasive carcinoma of breast 11/25/2022    Past Surgical History:  Procedure Laterality Date   BREAST BIOPSY Left    neg in past ? @ 2010   BREAST BIOPSY Left 11/19/2022   site 1,    3:00 7cmfn ribbon marker, path pending   BREAST BIOPSY Left 11/19/2022   site 2,   2:00 4cmfn, coil marker, path pending   BREAST BIOPSY  11/19/2022   site 3, 2:00 4cmfn, venus marker, path pending   BREAST BIOPSY Right 11/19/2022   [place] RT  BREAST BX W LOC DEV 1ST LESION IMG BX SPEC US GUIDE 11/19/2022 [facility]   BREAST BIOPSY Right 11/19/2022   [place] RT BREAST BX W LOC DEV EA ADD LESION IMG BX SPEC US GUIDE 11/19/2022 [facility]   BREAST BIOPSY Right 11/19/2022   [place] RT BREAST BX W LOC DEV EA ADD LESION IMG BX SPEC US GUIDE 11/19/2022 [facility]    FAMILY HISTORY:  We obtained a detailed, 4-generation family history.  Significant diagnoses are listed below: Family History  Problem Relation Age of Onset   Lung cancer Mother    Diabetes Father    Lung cancer Father    Lung cancer Brother    Breast cancer Neg Hx    Ms. Molina has 1 daughter, 5. She has 2 sisters and 3 brothers. One brother passed of metastatic cancer at 67,  possibly lung primary. Another brother was diagnosed with prostate cancer about 10 years ago in his 40s.   Ms. Talmadge mother had lung cancer and died at 34. No other known cancers on this side of the family.  Ms. Motherway father had lung cancer and also died at 45, no other known cancers on this side of the family.  Ms. Carro is unaware of previous family history of genetic testing for hereditary cancer risks. There is no reported Ashkenazi Jewish ancestry. There is no known consanguinity.  GENETIC COUNSELING ASSESSMENT: Ms. Lorella NimrodHarvey is a 57 y.o. female with a personal history of breast cancer and family history of prostate cancer which is somewhat suggestive of a hereditary cancer syndrome and predisposition to cancer. We, therefore, discussed and recommended the following at today's visit.   DISCUSSION: We discussed that approximately 10% of breast cancer is hereditary. Most cases of hereditary breast cancer are associated with BRCA1/BRCA2 genes, although there are other genes associated with hereditary  cancer as well. Cancers and risks are gene specific. We discussed that testing is beneficial for several reasons including knowing about cancer risks, identifying potential screening and  risk-reduction options that may be appropriate, and to understand if other family members could be at risk for cancer and allow them to undergo genetic testing.   We reviewed the characteristics, features and inheritance patterns of hereditary cancer syndromes. We also discussed genetic testing, including the appropriate family members to test, the process of testing, insurance coverage and turn-around-time for results. We discussed the implications of a negative, positive and/or variant of uncertain significant result. We recommended Ms. Lorella NimrodHarvey pursue genetic testing for the [facility] Hereditary Breast Cancer STAT+ Multi-Cancer+RNA gene panel.   Based on Ms. Castelli's personal and family history of cancer, she meets medical criteria for genetic testing. Despite that she meets criteria, she may still have an out of pocket cost. We discussed that if her out of pocket cost for testing is over $100, the laboratory will call and confirm whether she wants to proceed with testing.  If the out of pocket cost of testing is less than $100 she will be billed by the genetic testing laboratory.   PLAN: After considering the risks, benefits, and limitations, Ms. Lorella NimrodHarvey provided informed consent to pursue genetic testing and the blood sample was sent to [facility] for analysis of the Hereditary Breast Cancer STAT+Multi-Cancer+RNA panel. Results should be available within approximately 2-3 weeks' time, at which point they will be disclosed by telephone to Ms. Lorella NimrodHarvey, as will any additional recommendations warranted by these results. Ms. Lorella NimrodHarvey will receive a summary of her genetic counseling visit and a copy of her results once available. This information will also be available in Epic.   Ms. Blair HeysHarvey's questions were answered to her satisfaction today. Our contact information was provided should additional questions or concerns arise. Thank you for the referral and allowing us to share in the care of your patient.    Lacy DuverneyBrianna Alicha Raspberry, MS, [facility] Genetic Counselor [email or web address] Phone: [phone]  The patient was seen for a total of 20 minutes in face-to-face genetic counseling. Patient's husband was also present.  Dr. Orlie DakinFinnegan was available for discussion regarding this case.   _______________________________________________________________________ For Office Staff:  Number of people involved in session: 2 Was an Intern/ student involved with case: no

## 2022-11-25 NOTE — Progress Notes (Signed)
Accompanied patient and family to initial medical oncology appointment.   Reviewed Breast Cancer treatment handbook.   Care plan summary given to patient.   Reviewed outreach programs and cancer center services.   

## 2022-11-25 NOTE — Assessment & Plan Note (Signed)
Dicussed with patient. Curative intent.

## 2022-11-26 ENCOUNTER — Encounter: Payer: Self-pay | Admitting: *Deleted

## 2022-11-26 LAB — CANCER ANTIGEN 27.29: CA 27.29: 15.3 U/mL (ref 0.0–38.6)

## 2022-11-26 LAB — CANCER ANTIGEN 15-3: CA 15-3: 11.6 U/mL (ref 0.0–25.0)

## 2022-11-26 NOTE — Progress Notes (Signed)
Tina Payne called because she has changed her mind and would like reconstruction.  I have sent Dr. Tonna Boehringer a secure chat and also called his nurse Purnell Shoemaker.  They will send referral to Dr. Kittie Plater office.

## 2022-12-01 ENCOUNTER — Other Ambulatory Visit: Payer: Self-pay | Admitting: Surgery

## 2022-12-01 DIAGNOSIS — C50311 Malignant neoplasm of lower-inner quadrant of right female breast: Secondary | ICD-10-CM

## 2022-12-02 ENCOUNTER — Encounter: Payer: Self-pay | Admitting: Plastic Surgery

## 2022-12-02 ENCOUNTER — Encounter: Payer: Self-pay | Admitting: *Deleted

## 2022-12-02 ENCOUNTER — Encounter: Payer: 59 | Admitting: Licensed Clinical Social Worker

## 2022-12-02 ENCOUNTER — Other Ambulatory Visit: Payer: 59

## 2022-12-02 ENCOUNTER — Ambulatory Visit: Payer: Medicaid Other | Admitting: Plastic Surgery

## 2022-12-02 ENCOUNTER — Telehealth: Payer: Self-pay | Admitting: Licensed Clinical Social Worker

## 2022-12-02 VITALS — BP 143/93 | HR 71 | Ht 68.0 in | Wt 189.8 lb

## 2022-12-02 DIAGNOSIS — Z87891 Personal history of nicotine dependence: Secondary | ICD-10-CM | POA: Diagnosis not present

## 2022-12-02 DIAGNOSIS — C50911 Malignant neoplasm of unspecified site of right female breast: Secondary | ICD-10-CM

## 2022-12-02 DIAGNOSIS — E119 Type 2 diabetes mellitus without complications: Secondary | ICD-10-CM

## 2022-12-02 DIAGNOSIS — Z7984 Long term (current) use of oral hypoglycemic drugs: Secondary | ICD-10-CM

## 2022-12-02 NOTE — Progress Notes (Signed)
Patient ID: Tina Payne, female    DOB: 1965/09/25, 57 y.o.   MRN: [id number]   Chief Complaint  Patient presents with   Advice Only   Breast Cancer    The patient is a 57 year old female here with her husband for evaluation for breast reconstruction.  She felt a concerning area on her right breast and went for a workup.  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  Patient has a history of diabetes, gastric reflux, hypothyroidism, alcohol abuse and colon polyps.  She has had a colonoscopy and a right salpingectomy.  She quit smoking she is 5 feet 7 inches tall and weighs 189 pounds.  She is probably a DD cup size would like to be around the same size.  Her surgeon is Dr. Tonna Boehringer.  She has gone for genetic testing and should have that back in the next week.  She is thinking to have a right sided mastectomy unless her genetics is positive and then she will go with bilateral mastectomies.     Review of Systems  Constitutional: Negative.   HENT: Negative.    Eyes: Negative.   Respiratory: Negative.  Negative for chest tightness and shortness of breath.   Cardiovascular: Negative.   Gastrointestinal: Negative.   Endocrine: Negative.   Genitourinary: Negative.   Musculoskeletal: Negative.   Skin: Negative.     Past Medical History:  Diagnosis Date   Invasive carcinoma of breast 11/25/2022    Past Surgical History:  Procedure Laterality Date   BREAST BIOPSY Left    neg in past ? @ 2010   BREAST BIOPSY Left 11/19/2022   site 1,    3:00 7cmfn ribbon marker, path pending   BREAST BIOPSY Left 11/19/2022   site 2,   2:00 4cmfn, coil marker, path pending   BREAST BIOPSY  11/19/2022   site 3, 2:00 4cmfn, venus marker, path pending   BREAST BIOPSY Right 11/19/2022   [place] RT BREAST BX W LOC DEV 1ST LESION IMG BX SPEC US GUIDE 11/19/2022 [facility]   BREAST BIOPSY Right 11/19/2022   [place] RT BREAST BX W LOC DEV EA ADD LESION IMG BX SPEC US GUIDE 11/19/2022 [facility]    BREAST BIOPSY Right 11/19/2022   [place] RT BREAST BX W LOC DEV EA ADD LESION IMG BX SPEC US GUIDE 11/19/2022 [facility]      Current Outpatient Medications:    atorvastatin (LIPITOR) 20 MG tablet, Take 20 mg by mouth daily., Disp: , Rfl:    Cholecalciferol 50 MCG (2000 UT) TABS, Take 5,000 Units by mouth., Disp: , Rfl:    levothyroxine (SYNTHROID) 75 MCG tablet, Take by mouth., Disp: , Rfl:    metFORMIN (GLUCOPHAGE-XR) 500 MG 24 hr tablet, Take by mouth., Disp: , Rfl:    Objective:   Vitals:   12/02/22 1037  BP: (!) 143/93  Pulse: 71  SpO2: 98%    Physical Exam Vitals and nursing note reviewed.  Constitutional:      Appearance: Normal appearance.  HENT:     Head: Normocephalic and atraumatic.  Cardiovascular:     Rate and Rhythm: Normal rate.     Pulses: Normal pulses.  Pulmonary:     Effort: Pulmonary effort is normal.  Abdominal:     Palpations: Abdomen is soft.  Musculoskeletal:        General: No swelling or deformity.  Skin:    General: Skin is warm.     Capillary Refill: Capillary refill takes less than  2 seconds.     Coloration: Skin is not jaundiced.     Findings: No bruising.  Neurological:     Mental Status: She is alert and oriented to person, place, and time.  Psychiatric:        Mood and Affect: Mood normal.        Behavior: Behavior normal.        Thought Content: Thought content normal.        Judgment: Judgment normal.     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  There are two general categories of reconstruction.  We can reconstruction a breast with implants or use the patient's own tissue.  These were further discussed as listed.  Breast reconstruction is an optional procedure and  eligibility depends on the full spectrum of the health of the patient and any co-morbidities.  More than one surgery is often needed to complete the reconstruction process.  The process can take three to twelve months to complete.  The breasts will not be identical due to many factors such as rib differences, shoulder asymmetry and treatments such as radiation.  The goal is to get the breasts to look normal and symmetrical in clothes.  Scars are a part of surgery and may fade some in time but will always be present under clothes.  Surgery may be an option on the non-cancer breast to achieve more symmetry.  No matter which procedure is chosen there is always the risk of complications and even failure of the body to heal.  This could result in no breast.    The options for reconstruction include:  1. Placement of a tissue expander with Acellular dermal matrix. When the expander is the desired size surgery is performed to remove the expander and place an implant.  In some cases the implant can be placed without an expander.  2. Autologous reconstruction can include using a muscle or tissue from another area of the body to create a breast.  3. Combined procedures (ie. latissismus dorsi flap) can be done with an expander / implant placed under the muscle.   The risks, benefits, scars and recovery time were discussed for each of the above. Risks include bleeding, infection, hematoma, seroma, scarring, pain, wound healing complications, flap loss, fat necrosis, capsular contracture, need for implant removal, donor site complications, bulge, hernia, umbilical necrosis, need for urgent reoperation, and need for dressing changes.   The procedure the patient selected / that was best for the patient, was then discussed in further detail.  Total time: 45 minutes. This includes time spent with the patient during the visit as well as time spent before and after the visit reviewing the chart, documenting the encounter,  making phone calls and reviewing studies.   The patient is a candidate for right breast reconstruction with expander and Flex HD.  It is not without risks because of her diabetes and overall health condition.  We did discuss this.  I do not think it is unreasonable to try to go ahead with reconstruction and that is what the patient would like to do.  At this point in time she is planning on a right mastectomy with reconstruction and will only do bilateral reconstruction if her genetics is positive.  I have  talked to Dr. Tonna Boehringer and made him aware of the above information.  Pictures were obtained of the patient and placed in the chart with the patient's or guardian's permission.   Alena Bills Sumner Boesch, DO

## 2022-12-02 NOTE — Progress Notes (Signed)
Tina Payne called to ask about her genetic testing results.   [name] will be calling her shortly with the results.   She also had some questions about upcoming appointment.   All questions answered to her satisfaction.

## 2022-12-02 NOTE — Telephone Encounter (Signed)
I contacted [name] to discuss her genetic testing results. No pathogenic variants were identified in the 9 genes analyzed. These genes are associated with the highest risks for breast cancer. Detailed clinic note to follow. The remainder of results are still pending and [name] will be called with those once they return.   The test report has been scanned into EPIC and is located under the Molecular Pathology section of the Results Review tab.  A portion of the result report is included below for reference.      Tina Duverney, MS, [facility] Genetic Counselor [email or web address] Phone: [phone]

## 2022-12-08 ENCOUNTER — Telehealth: Payer: Self-pay | Admitting: *Deleted

## 2022-12-08 NOTE — Telephone Encounter (Signed)
Auth started via [employer] - [id number] for CPT [id number], [id number], [phone]

## 2022-12-09 ENCOUNTER — Encounter: Payer: Medicaid Other | Admitting: Plastic Surgery

## 2022-12-09 ENCOUNTER — Encounter: Payer: Self-pay | Admitting: *Deleted

## 2022-12-09 NOTE — Progress Notes (Signed)
Mastectomy is scheduled for 5/16.  She will see Dr. Cathie Hoops on 5/31.  Appt details given.

## 2022-12-10 ENCOUNTER — Inpatient Hospital Stay: Admission: RE | Admit: 2022-12-10 | Payer: 59 | Source: Ambulatory Visit

## 2022-12-17 ENCOUNTER — Telehealth: Payer: Self-pay | Admitting: Licensed Clinical Social Worker

## 2022-12-17 NOTE — Telephone Encounter (Signed)
I contacted Tina Payne to discuss the remainder of her genetic testing results. No pathogenic variants were identified in the 70 genes analyzed. Detailed clinic note to follow.   The test report has been scanned into EPIC and is located under the Molecular Pathology section of the Results Review tab.  A portion of the result report is included below for reference.      Lacy Duverney, MS, [facility] Genetic Counselor [email or web address] Phone: [phone]

## 2022-12-18 ENCOUNTER — Ambulatory Visit: Payer: Self-pay | Admitting: Licensed Clinical Social Worker

## 2022-12-18 ENCOUNTER — Other Ambulatory Visit: Payer: 59

## 2022-12-18 ENCOUNTER — Encounter: Payer: Self-pay | Admitting: Licensed Clinical Social Worker

## 2022-12-18 DIAGNOSIS — Z1379 Encounter for other screening for genetic and chromosomal anomalies: Secondary | ICD-10-CM

## 2022-12-18 NOTE — Progress Notes (Signed)
HPI:   Tina Payne was previously seen in the [facility] due to a personal and family history of cancer and concerns regarding a hereditary predisposition to cancer. Please refer to our prior cancer genetics clinic note for more information regarding our discussion, assessment and recommendations, at the time. Tina Payne recent genetic test results were disclosed to her, as were recommendations warranted by these results. These results and recommendations are discussed in more detail below.  CANCER HISTORY:  Oncology History  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)  11/06/2022 Imaging   Patient palpated right breast mass  Bilateral diagnostic mammogram showed  1. At the site of palpable concern in the RIGHT breast, there is an irregular 13 mm mass. Recommend ultrasound-guided biopsy for definitive characterization. 2. Extending medially from this mass is a heterogeneous masslike area spanning 27 mm. Recommend ultrasound-guided biopsy of a portion of this masslike area for definitive characterization. 3. At 3 o'clock 7 cm from the nipple, there is an irregular hypoechoic mass measuring approximately 12 mm in maximum dimension. Recommend ultrasound-guided biopsy for definitive characterization. 4. Mammographically, there is focal asymmetry with associated architectural distortion noted in the region of palpable concern. Recommend attention on post marker placement mammogram to assess for adequate sampling of this area and mammographic/sonographic correlation. 5. No suspicious RIGHT axillary adenopathy. 6. No mammographic evidence of malignancy in the LEFT breast.   11/19/2022 Initial Diagnosis   Invasive carcinoma of breast  - s/p right breast biopsy on 11/19/2022   A. BREAST, RIGHT AT 3:00, 7 CM FROM NIPPLE; ULTRASOUND-GUIDED CORE NEEDLE BIOPSY (RIBBON CLIP): - DUCTAL CARCINOMA IN SITU, HIGH-GRADE, WITH COMEDONECROSIS. - NEGATIVE FOR  INVASIVE CARCINOMA.   B.BREAST, RIGHT AT 2:00, 4 CM FROM THE NIPPLE, MEDIAL ASPECT;  ULTRASOUND-GUIDED CORE NEEDLE BIOPSY (COIL CLIP):  - MICROINVASIVE MAMMARY CARCINOMA, NO SPECIAL TYPE. Grade 2, high grade DCIS with comedonecrosis  C BREAST, RIGHT AT 2:00, 4 CM FROM THE NIPPLE, LATERAL ASPECT;  ULTRASOUND-GUIDED CORE NEEDLE BIOPSY (VENUS CLIP):  - MICROINVASIVE MAMMARY CARCINOMA, NO SPECIAL TYPE. Grade 2, high grade DCIS with comedonecrosis  Menarche at age of 39 First live birth at age of 55 OCP use: >5 years,  History of hysterectomy: no Menopausal status: menopaused at 44 or 57 yo.  History of HRT use: no  History of chest radiation: no Number of previous breast biopsies:  once more than 30 years ago    11/25/2022 Cancer Staging   Staging form: Breast, AJCC 8th Edition - Clinical stage from 11/25/2022: cT2mi, cN0, cM0, ER: Not Assessed, PR: Not Assessed, HER2: Not Assessed - Signed by Rickard Patience, MD on 11/25/2022 Stage prefix: Initial diagnosis    Genetic Testing   No pathogenic variants identified on the [facility] Multi-Cancer+RNA panel. VUS in NF2 called c.662A>G ([id number]) identified. The report date is 12/10/2022.  The Multi-Cancer + RNA Panel offered by [facility] includes sequencing and/or deletion/duplication analysis of the following 70 genes:  AIP*, ALK, APC*, ATM*, AXIN2*, BAP1*, BARD1*, BLM*, BMPR1A*, BRCA1*, BRCA2*, BRIP1*, CDC73*, CDH1*, CDK4, CDKN1B*, CDKN2A, CHEK2*, CTNNA1*, DICER1*, EPCAM, EGFR, FH*, FLCN*, GREM1, HOXB13, KIT, LZTR1, MAX*, MBD4, MEN1*, MET, MITF, MLH1*, MSH2*, MSH3*, MSH6*, MUTYH*, NF1*, NF2*, NTHL1*, PALB2*, PDGFRA, PMS2*, POLD1*, POLE*, POT1*, PRKAR1A*, PTCH1*, PTEN*, RAD51C*, RAD51D*, RB1*, RET, SDHA*, SDHAF2*, SDHB*, SDHC*, SDHD*, SMAD4*, SMARCA4*, SMARCB1*, SMARCE1*, STK11*, SUFU*, TMEM127*, TP53*, TSC1*, TSC2*, VHL*. RNA analysis is performed for * genes.     FAMILY HISTORY:  We obtained a detailed, 4-generation family history.  Significant diagnoses  are listed below: Family History  Problem Relation Age of Onset   Lung cancer Mother    Diabetes Father    Lung cancer Father    Lung cancer Brother    Breast cancer Neg Hx     Tina Payne has 1 daughter, 105. She has 2 sisters and 3 brothers. One brother passed of metastatic cancer at 56,  possibly lung primary. Another brother was diagnosed with prostate cancer about 10 years ago in his 58s.    Tina Payne mother had lung cancer and died at 51. No other known cancers on this side of the family.   Tina Payne father had lung cancer and also died at 40, no other known cancers on this side of the family.   Tina Payne is unaware of previous family history of genetic testing for hereditary cancer risks. There is no reported Ashkenazi Jewish ancestry. There is no known consanguinity.     GENETIC TEST RESULTS:  The [facility] Multi-Cancer+RNA Panel found no pathogenic mutations.   The Multi-Cancer + RNA Panel offered by [facility] includes sequencing and/or deletion/duplication analysis of the following 70 genes:  AIP*, ALK, APC*, ATM*, AXIN2*, BAP1*, BARD1*, BLM*, BMPR1A*, BRCA1*, BRCA2*, BRIP1*, CDC73*, CDH1*, CDK4, CDKN1B*, CDKN2A, CHEK2*, CTNNA1*, DICER1*, EPCAM, EGFR, FH*, FLCN*, GREM1, HOXB13, KIT, LZTR1, MAX*, MBD4, MEN1*, MET, MITF, MLH1*, MSH2*, MSH3*, MSH6*, MUTYH*, NF1*, NF2*, NTHL1*, PALB2*, PDGFRA, PMS2*, POLD1*, POLE*, POT1*, PRKAR1A*, PTCH1*, PTEN*, RAD51C*, RAD51D*, RB1*, RET, SDHA*, SDHAF2*, SDHB*, SDHC*, SDHD*, SMAD4*, SMARCA4*, SMARCB1*, SMARCE1*, STK11*, SUFU*, TMEM127*, TP53*, TSC1*, TSC2*, VHL*. RNA analysis is performed for * genes.   The test report has been scanned into EPIC and is located under the Molecular Pathology section of the Results Review tab.  A portion of the result report is included below for reference. Genetic testing reported out on 12/10/2022.      Genetic testing identified a variant of uncertain significance (VUS) in the NF2 gene called c.662A>G.  At this  time, it is unknown if this variant is associated with an increased risk for cancer or if it is benign, but most uncertain variants are reclassified to benign. It should not be used to make medical management decisions. With time, we suspect the laboratory will determine the significance of this variant, if any. If the laboratory reclassifies this variant, we will attempt to contact Tina Payne to discuss it further.   Even though a pathogenic variant was not identified, possible explanations for the cancer in the family may include: There may be no hereditary risk for cancer in the family. The cancers in Tina Payne and/or her family may be sporadic/familial or due to other genetic and environmental factors. There may be a gene mutation in one of these genes that current testing methods cannot detect but that chance is small. There could be another gene that has not yet been discovered, or that we have not yet tested, that is responsible for the cancer diagnoses in the family.  It is also possible there is a hereditary cause for the cancer in the family that Tina Payne did not inherit. The variant of uncertain significance detected in the NF2 gene may be reclassified as a pathogenic variant in the future. At this time, we do not know if this variant increases the risk for cancer.  Therefore, it is important to remain in touch with cancer genetics in the future so that we can continue to offer Tina Payne the most up to date genetic testing.  ADDITIONAL GENETIC TESTING:  We discussed with Tina Payne that her genetic testing was fairly extensive.  If there are additional relevant genes identified to increase cancer risk that can be analyzed in the future, we would be happy to discuss and coordinate this testing at that time.     CANCER SCREENING RECOMMENDATIONS:  Tina Payne test result is considered negative (normal).  This means that we have not identified a hereditary cause for her personal and  family history of cancer at this time.   An individual's cancer risk and medical management are not determined by genetic test results alone. Overall cancer risk assessment incorporates additional factors, including personal medical history, family history, and any available genetic information that may result in a personalized plan for cancer prevention and surveillance. Therefore, it is recommended she continue to follow the cancer management and screening guidelines provided by her oncology and primary healthcare provider.  RECOMMENDATIONS FOR FAMILY MEMBERS:   Since she did not inherit a identifiable mutation in a cancer predisposition gene included on this panel, her children could not have inherited a known mutation from her in one of these genes. Individuals in this family might be at some increased risk of developing cancer, over the general population risk, due to the family history of cancer.  Individuals in the family should notify their providers of the family history of cancer. We recommend women in this family have a yearly mammogram beginning at age 21, or 59 years younger than the earliest onset of cancer, an annual clinical breast exam, and perform monthly breast self-exams.  Family members should have colonoscopies by at age 51, or earlier, as recommended by their providers. We do not recommend familial testing for the NF2 variant of uncertain significance (VUS).  FOLLOW-UP:  Lastly, we discussed with Tina Payne that cancer genetics is a rapidly advancing field and it is possible that new genetic tests will be appropriate for her and/or her family members in the future. We encouraged her to remain in contact with cancer genetics on an annual basis so we can update her personal and family histories and let her know of advances in cancer genetics that may benefit this family.   Our contact number was provided. Ms. Dunkerson questions were answered to her satisfaction, and she knows she is  welcome to call us at anytime with additional questions or concerns.    Lacy Duverney, MS, [facility] Genetic Counselor [email or web address] Phone: [phone]

## 2022-12-22 ENCOUNTER — Ambulatory Visit (INDEPENDENT_AMBULATORY_CARE_PROVIDER_SITE_OTHER): Payer: Medicaid Other | Admitting: Physician Assistant

## 2022-12-22 DIAGNOSIS — C50911 Malignant neoplasm of unspecified site of right female breast: Secondary | ICD-10-CM

## 2022-12-22 MED ORDER — ONDANSETRON HCL 4 MG PO TABS: 4.0000 mg | ORAL_TABLET | Freq: Three times a day (TID) | ORAL | 0 refills | Status: AC | PRN

## 2022-12-22 MED ORDER — OXYCODONE-ACETAMINOPHEN 5-325 MG PO TABS: 1.0000 | ORAL_TABLET | Freq: Four times a day (QID) | ORAL | 0 refills | Status: DC | PRN

## 2022-12-22 MED ORDER — CEPHALEXIN 500 MG PO CAPS: 500.0000 mg | ORAL_CAPSULE | Freq: Four times a day (QID) | ORAL | 0 refills | Status: DC

## 2022-12-22 NOTE — H&P (View-Only) (Signed)
   Patient ID: Tina Payne, female    DOB: 06/11/1966, 57 y.o.   MRN: [id number]  Chief Complaint  Patient presents with   Pre-op Exam    No diagnosis found.   History of Present Illness: Tina Payne is a 57 y.o.  female  with a history of right-sided ductal carcinoma in situ.  She presents for preoperative evaluation for upcoming procedure, immediate breast reconstruction with placement of tissue expander and Flex HD, scheduled for 01/01/2023 with Dr. Dillingham.  The patient has not had problems with anesthesia.   Summary of Previous Visit: The patient was last seen in the office on 12/02/2022.  She had not a area concerning in her right breast, she had mammogram which showed right-sided ductal carcinoma in situ.  Patient has a history of diabetes, gastric reflux, hypothyroidism, alcohol abuse and colon polyps.  She is a former smoker.  She is 5 foot 7 inches tall and weighs 189 pounds.  She is probably a double D cup and would like to be around the same size.  Her breast surgeon is Dr. Sakai.  Has followed up with genetic testing which shows no genetic risk factors for breast cancer.  She notes otherwise she is healthy, she denies any history of DVT or PE.  She denies any issues with anesthesia previously.  She does not take any antiplatelets or anticoagulants.  She does note a history of varicose veins.    Job: Works from home  PMH Significant for: Type 2 diabetes ( last A1c 7.0), gastric reflux, hypothyroidism   Past Medical History: Allergies: No Known Allergies  Current Medications:  Current Outpatient Medications:    ALPRAZolam (XANAX) 0.5 MG tablet, BID as needed for anxiety/stress, Disp: , Rfl:    atorvastatin (LIPITOR) 20 MG tablet, Take 20 mg by mouth daily., Disp: , Rfl:    Cholecalciferol 50 MCG (2000 UT) TABS, Take 5,000 Units by mouth., Disp: , Rfl:    levothyroxine (SYNTHROID) 75 MCG tablet, Take by mouth., Disp: , Rfl:    metFORMIN (GLUCOPHAGE-XR)  500 MG 24 hr tablet, Take by mouth., Disp: , Rfl:   Past Medical Problems: Past Medical History:  Diagnosis Date   Invasive carcinoma of breast (HCC) 11/25/2022    Past Surgical History: Past Surgical History:  Procedure Laterality Date   BREAST BIOPSY Left    neg in past ? @ 2010   BREAST BIOPSY Left 11/19/2022   site 1,    3:00 7cmfn ribbon marker, path pending   BREAST BIOPSY Left 11/19/2022   site 2,   2:00 4cmfn, coil marker, path pending   BREAST BIOPSY  11/19/2022   site 3, 2:00 4cmfn, venus marker, path pending   BREAST BIOPSY Right 11/19/2022   US RT BREAST BX W LOC DEV 1ST LESION IMG BX SPEC US GUIDE 11/19/2022 [facility]   BREAST BIOPSY Right 11/19/2022   US RT BREAST BX W LOC DEV EA ADD LESION IMG BX SPEC US GUIDE 11/19/2022 [facility]   BREAST BIOPSY Right 11/19/2022   US RT BREAST BX W LOC DEV EA ADD LESION IMG BX SPEC US GUIDE 11/19/2022 [facility]    Social History: Social History   Socioeconomic History   Marital status: Married    Spouse name: Not on file   Number of children: Not on file   Years of education: Not on file   Highest education level: Not on file  Occupational History   Not on file  Tobacco Use     Smoking status: Former    Types: Cigarettes    Quit date: 2020    Years since quitting: 4.3   Smokeless tobacco: Never  Vaping Use   Vaping Use: Never used  Substance and Sexual Activity   Alcohol use: No   Drug use: No   Sexual activity: Not on file  Other Topics Concern   Not on file  Social History Narrative   Not on file   Social Determinants of Health   Financial Resource Strain: Not on file  Food Insecurity: No Food Insecurity (11/25/2022)   Hunger Vital Sign    Worried About Running Out of Food in the Last Year: Never true    Ran Out of Food in the Last Year: Never true  Transportation Needs: No Transportation Needs (11/25/2022)   PRAPARE - Transportation    Lack of Transportation (Medical): No    Lack of Transportation  (Non-Medical): No  Physical Activity: Not on file  Stress: Not on file  Social Connections: Not on file  Intimate Partner Violence: Not At Risk (11/25/2022)   Humiliation, Afraid, Rape, and Kick questionnaire    Fear of Current or Ex-Partner: No    Emotionally Abused: No    Physically Abused: No    Sexually Abused: No    Family History: Family History  Problem Relation Age of Onset   Lung cancer Mother    Diabetes Father    Lung cancer Father    Lung cancer Brother    Breast cancer Neg Hx     Review of Systems: ROS  Physical Exam: Vital Signs There were no vitals taken for this visit.  Physical Exam  Constitutional:      General: Not in acute distress.    Appearance: Normal appearance. Not ill-appearing.  HENT:     Head: Normocephalic and atraumatic.  Eyes:     Pupils: Pupils are equal, round. Cardiovascular:     Rate and Rhythm: Normal rate. Pulmonary:     Effort: No respiratory distress or increased work of breathing.  Speaks in full sentences. Musculoskeletal: Normal range of motion. No lower extremity swelling or edema.  Skin:    General: Skin is warm and dry.     Findings: No erythema or rash.  Neurological:     Mental Status: Alert and oriented to person, place, and time.  Psychiatric:        Mood and Affect: Mood normal.        Behavior: Behavior normal.    Assessment/Plan: The patient is scheduled for immediate right-sided breast reconstruction placement of tissue expander and Flex HD with Dr. Dillingham.  Risks, benefits, and alternatives of procedure discussed, questions answered and consent obtained.    Smoking Status: Non-smoker  Caprini Score: 7; Risk Factors include: Age, BMI, active malignancy, varicose veins, length of surgery; Recommendation is early ambulation  Pictures obtained: Previous visit  Post-op Rx sent to pharmacy: Percocet, Zofran, Keflex  Patient was provided with the breast reconstruction consent document and Pain Medication  Agreement prior to their appointment.  They had adequate time to read through the risk consent documents and Pain Medication Agreement. We also discussed them in person together during this preop appointment. All of their questions were answered to their satisfaction.  Recommended calling if they have any further questions.  Risk consent form and Pain Medication Agreement to be scanned into patient's chart.   Electronically signed by: Bethani Brugger Todd Brayley Mackowiak, PA-C 12/22/2022 1:08 PM  

## 2022-12-22 NOTE — Progress Notes (Signed)
Patient ID: Tina Payne, female    DOB: 01/09/1966, 57 y.o.   MRN: [id number]  Chief Complaint  Patient presents with   Pre-op Exam    No diagnosis found.   History of Present Illness: Tina Payne is a 57 y.o.  female  with a history of right-sided ductal carcinoma in situ.  She presents for preoperative evaluation for upcoming procedure, immediate breast reconstruction with placement of tissue expander and Flex HD, scheduled for 01/01/2023 with Dr. Ulice Bold.  The patient has not had problems with anesthesia.   Summary of Previous Visit: The patient was last seen in the office on 12/02/2022.  She had not a area concerning in her right breast, she had mammogram which showed right-sided ductal carcinoma in situ.  Patient has a history of diabetes, gastric reflux, hypothyroidism, alcohol abuse and colon polyps.  She is a former smoker.  She is 5 foot 7 inches tall and weighs 189 pounds.  She is probably a double D cup and would like to be around the same size.  Her breast surgeon is Dr. Tonna Boehringer.  Has followed up with genetic testing which shows no genetic risk factors for breast cancer.  She notes otherwise she is healthy, she denies any history of DVT or PE.  She denies any issues with anesthesia previously.  She does not take any antiplatelets or anticoagulants.  She does note a history of varicose veins.    Job: Works from home  PMH Significant for: Type 2 diabetes ( last A1c 7.0), gastric reflux, hypothyroidism   Past Medical History: Allergies: No Known Allergies  Current Medications:  Current Outpatient Medications:    ALPRAZolam (XANAX) 0.5 MG tablet, BID as needed for anxiety/stress, Disp: , Rfl:    atorvastatin (LIPITOR) 20 MG tablet, Take 20 mg by mouth daily., Disp: , Rfl:    Cholecalciferol 50 MCG (2000 UT) TABS, Take 5,000 Units by mouth., Disp: , Rfl:    levothyroxine (SYNTHROID) 75 MCG tablet, Take by mouth., Disp: , Rfl:    metFORMIN (GLUCOPHAGE-XR)  500 MG 24 hr tablet, Take by mouth., Disp: , Rfl:   Past Medical Problems: Past Medical History:  Diagnosis Date   Invasive carcinoma of breast (HCC) 11/25/2022    Past Surgical History: Past Surgical History:  Procedure Laterality Date   BREAST BIOPSY Left    neg in past ? @ 2010   BREAST BIOPSY Left 11/19/2022   site 1,    3:00 7cmfn ribbon marker, path pending   BREAST BIOPSY Left 11/19/2022   site 2,   2:00 4cmfn, coil marker, path pending   BREAST BIOPSY  11/19/2022   site 3, 2:00 4cmfn, venus marker, path pending   BREAST BIOPSY Right 11/19/2022   [place] RT BREAST BX W LOC DEV 1ST LESION IMG BX SPEC US GUIDE 11/19/2022 [facility]   BREAST BIOPSY Right 11/19/2022   [place] RT BREAST BX W LOC DEV EA ADD LESION IMG BX SPEC US GUIDE 11/19/2022 [facility]   BREAST BIOPSY Right 11/19/2022   [place] RT BREAST BX W LOC DEV EA ADD LESION IMG BX SPEC US GUIDE 11/19/2022 [facility]    Social History: Social History   Socioeconomic History   Marital status: Married    Spouse name: Not on file   Number of children: Not on file   Years of education: Not on file   Highest education level: Not on file  Occupational History   Not on file  Tobacco Use  Smoking status: Former    Types: Cigarettes    Quit date: 2020    Years since quitting: 4.3   Smokeless tobacco: Never  Vaping Use   Vaping Use: Never used  Substance and Sexual Activity   Alcohol use: No   Drug use: No   Sexual activity: Not on file  Other Topics Concern   Not on file  Social History Narrative   Not on file   Social Determinants of Health   Financial Resource Strain: Not on file  Food Insecurity: No Food Insecurity (11/25/2022)   Hunger Vital Sign    Worried About Running Out of Food in the Last Year: Never true    Ran Out of Food in the Last Year: Never true  Transportation Needs: No Transportation Needs (11/25/2022)   PRAPARE - Administrator, Civil Service (Medical): No    Lack of Transportation  (Non-Medical): No  Physical Activity: Not on file  Stress: Not on file  Social Connections: Not on file  Intimate Partner Violence: Not At Risk (11/25/2022)   Humiliation, Afraid, Rape, and Kick questionnaire    Fear of Current or Ex-Partner: No    Emotionally Abused: No    Physically Abused: No    Sexually Abused: No    Family History: Family History  Problem Relation Age of Onset   Lung cancer Mother    Diabetes Father    Lung cancer Father    Lung cancer Brother    Breast cancer Neg Hx     Review of Systems: ROS  Physical Exam: Vital Signs There were no vitals taken for this visit.  Physical Exam  Constitutional:      General: Not in acute distress.    Appearance: Normal appearance. Not ill-appearing.  HENT:     Head: Normocephalic and atraumatic.  Eyes:     Pupils: Pupils are equal, round. Cardiovascular:     Rate and Rhythm: Normal rate. Pulmonary:     Effort: No respiratory distress or increased work of breathing.  Speaks in full sentences. Musculoskeletal: Normal range of motion. No lower extremity swelling or edema.  Skin:    General: Skin is warm and dry.     Findings: No erythema or rash.  Neurological:     Mental Status: Alert and oriented to person, place, and time.  Psychiatric:        Mood and Affect: Mood normal.        Behavior: Behavior normal.    Assessment/Plan: The patient is scheduled for immediate right-sided breast reconstruction placement of tissue expander and Flex HD with Dr. Ulice Bold.  Risks, benefits, and alternatives of procedure discussed, questions answered and consent obtained.    Smoking Status: Non-smoker  Caprini Score: 7; Risk Factors include: Age, BMI, active malignancy, varicose veins, length of surgery; Recommendation is early ambulation  Pictures obtained: Previous visit  Post-op Rx sent to pharmacy: Percocet, Zofran, Keflex  Patient was provided with the breast reconstruction consent document and Pain Medication  Agreement prior to their appointment.  They had adequate time to read through the risk consent documents and Pain Medication Agreement. We also discussed them in person together during this preop appointment. All of their questions were answered to their satisfaction.  Recommended calling if they have any further questions.  Risk consent form and Pain Medication Agreement to be scanned into patient's chart.   Electronically signed by: Kelle Darting Alaina Donati, PA-C 12/22/2022 1:08 PM

## 2022-12-23 ENCOUNTER — Encounter
Admission: RE | Admit: 2022-12-23 | Discharge: 2022-12-23 | Disposition: A | Discharge status: Home / Self Care | Payer: 59 | Source: Ambulatory Visit | Attending: Surgery | Admitting: Surgery

## 2022-12-23 ENCOUNTER — Other Ambulatory Visit: Payer: Self-pay

## 2022-12-23 ENCOUNTER — Encounter
Admission: RE | Admit: 2022-12-23 | Discharge: 2022-12-23 | Disposition: A | Discharge status: Home / Self Care | Payer: Medicaid Other | Source: Ambulatory Visit | Attending: Surgery | Admitting: Surgery

## 2022-12-23 ENCOUNTER — Telehealth: Payer: Self-pay | Admitting: Plastic Surgery

## 2022-12-23 DIAGNOSIS — E119 Type 2 diabetes mellitus without complications: Secondary | ICD-10-CM | POA: Diagnosis not present

## 2022-12-23 DIAGNOSIS — Z01810 Encounter for preprocedural cardiovascular examination: Secondary | ICD-10-CM | POA: Diagnosis present

## 2022-12-23 HISTORY — DX: Type 2 diabetes mellitus without complications: E11.9

## 2022-12-23 HISTORY — DX: Anxiety disorder, unspecified: F41.9

## 2022-12-23 HISTORY — DX: Hypothyroidism, unspecified: E03.9

## 2022-12-23 HISTORY — DX: Residual foreign body in soft tissue: M79.5

## 2022-12-23 HISTORY — DX: Anemia, unspecified: D64.9

## 2022-12-23 HISTORY — DX: Pneumonia, unspecified organism: J18.9

## 2022-12-23 NOTE — Patient Instructions (Addendum)
Your procedure is scheduled on: 01/01/23 - Thursday Report to the Registration Desk on the 1st floor of the [facility] at 9:00 am.  REMEMBER: Instructions that are not followed completely may result in serious medical risk, up to and including death; or upon the discretion of your surgeon and anesthesiologist your surgery may need to be rescheduled.  Do not eat food after midnight the night before surgery.  No gum chewing or hard candies.  You may water up to 2 hours before you are scheduled to arrive for your surgery. Do not drink anything within 2 hours of your scheduled arrival time.  One week prior to surgery: Stop Anti-inflammatories (NSAIDS) such as Advil, Aleve, Ibuprofen, Motrin, Naproxen, Naprosyn and Aspirin based products such as Excedrin, Goody's Powder, BC Powder.  Stop ANY OVER THE COUNTER supplements until after surgery.  You may however, continue to take Tylenol if needed for pain up until the day of surgery.  Continue taking all prescribed medications with the exception of the following:  1.. HOLD metFORMIN (GLUCOPHAGE-XR) 2 days prior, beginning 12/30/22.  TAKE ONLY THESE MEDICATIONS THE MORNING OF SURGERY WITH A SIP OF WATER:  atorvastatin (LIPITOR)  cephALEXin (KEFLEX)  levothyroxine (SYNTHROID)    No Alcohol for 24 hours before or after surgery.  No Smoking including e-cigarettes for 24 hours before surgery.  No chewable tobacco products for at least 6 hours before surgery.  No nicotine patches on the day of surgery.  Do not use any "recreational" drugs for at least a week (preferably 2 weeks) before your surgery.  Please be advised that the combination of cocaine and anesthesia may have negative outcomes, up to and including death. If you test positive for cocaine, your surgery will be cancelled.  On the morning of surgery brush your teeth with toothpaste and water, you may rinse your mouth with mouthwash if you wish. Do not swallow any toothpaste or  mouthwash.  Use CHG Soap or wipes as directed on instruction sheet.  Do not wear jewelry, make-up, hairpins, clips or nail polish.  Do not wear lotions, powders, or perfumes.   Do not shave body hair from the neck down 48 hours before surgery.  Contact lenses, hearing aids and dentures may not be worn into surgery.  Do not bring valuables to the hospital. [facility] is not responsible for any missing/lost belongings or valuables.   Notify your doctor if there is any change in your medical condition (cold, fever, infection).  Wear comfortable clothing (specific to your surgery type) to the hospital.  After surgery, you can help prevent lung complications by doing breathing exercises.  Take deep breaths and cough every 1-2 hours. Your doctor may order a device called an Incentive Spirometer to help you take deep breaths. When coughing or sneezing, hold a pillow firmly against your incision with both hands. This is called "splinting." Doing this helps protect your incision. It also decreases belly discomfort.  If you are being admitted to the hospital overnight, leave your suitcase in the car. After surgery it may be brought to your room.  In case of increased patient census, it may be necessary for you, the patient, to continue your postoperative care in the Same Day Surgery department.  If you are being discharged the day of surgery, you will not be allowed to drive home. You will need a responsible individual to drive you home and stay with you for 24 hours after surgery.   If you are taking public transportation, you  will need to have a responsible individual with you.  Please call the Pre-admissions Testing Dept. at [phone] if you have any questions about these instructions.  Surgery Visitation Policy:  Patients having surgery or a procedure may have two visitors.  Children under the age of 80 must have an adult with them who is not the patient.  Inpatient Visitation:     Visiting hours are 7 a.m. to 8 p.m. Up to four visitors are allowed at one time in a patient room. The visitors may rotate out with other people during the day.  One visitor age 97 or older may stay with the patient overnight and must be in the room by 8 p.m.    Preparing for Surgery with CHLORHEXIDINE GLUCONATE (CHG) Soap  Chlorhexidine Gluconate (CHG) Soap  o An antiseptic cleaner that kills germs and bonds with the skin to continue killing germs even after washing  o Used for showering the night before surgery and morning of surgery  Before surgery, you can play an important role by reducing the number of germs on your skin.  CHG (Chlorhexidine gluconate) soap is an antiseptic cleanser which kills germs and bonds with the skin to continue killing germs even after washing.  Please do not use if you have an allergy to CHG or antibacterial soaps. If your skin becomes reddened/irritated stop using the CHG.  1. Shower the NIGHT BEFORE SURGERY and the MORNING OF SURGERY with CHG soap.  2. If you choose to wash your hair, wash your hair first as usual with your normal shampoo.  3. After shampooing, rinse your hair and body thoroughly to remove the shampoo.  4. Use CHG as you would any other liquid soap. You can apply CHG directly to the skin and wash gently with a scrungie or a clean washcloth.  5. Apply the CHG soap to your body only from the neck down. Do not use on open wounds or open sores. Avoid contact with your eyes, ears, mouth, and genitals (private parts). Wash face and genitals (private parts) with your normal soap.  6. Wash thoroughly, paying special attention to the area where your surgery will be performed.  7. Thoroughly rinse your body with warm water.  8. Do not shower/wash with your normal soap after using and rinsing off the CHG soap.  9. Pat yourself dry with a clean towel.  10. Wear clean pajamas to bed the night before surgery.  12. Place clean sheets on  your bed the night of your first shower and do not sleep with pets.  13. Shower again with the CHG soap on the day of surgery prior to arriving at the hospital.  14. Do not apply any deodorants/lotions/powders.  15. Please wear clean clothes to the hospital.

## 2022-12-23 NOTE — Telephone Encounter (Signed)
I called Tina Payne and explained the antibiotics and other medications prescribed during the pre-op appointment are for her to take after surgery, not before. She verbalized understanding and states she will contact the office if she has any other questions.

## 2022-12-23 NOTE — Telephone Encounter (Signed)
Pt called asking when she can start taking her antibiotic, she stated she has a sx coming up 01-01-23 and wasn't sure.

## 2022-12-24 ENCOUNTER — Encounter: Payer: Self-pay | Admitting: *Deleted

## 2022-12-24 DIAGNOSIS — C50919 Malignant neoplasm of unspecified site of unspecified female breast: Secondary | ICD-10-CM

## 2022-12-31 ENCOUNTER — Telehealth: Payer: Self-pay | Admitting: *Deleted

## 2022-12-31 NOTE — Telephone Encounter (Signed)
Reference Number: [id number]    NOTIFIED THAT PT NOW HAS [facility]. NEW AUTH SUBMITTED [id number], [id number], [phone]

## 2023-01-01 ENCOUNTER — Ambulatory Visit: Payer: 59 | Admitting: Urgent Care

## 2023-01-01 ENCOUNTER — Encounter: Payer: Self-pay | Admitting: Surgery

## 2023-01-01 ENCOUNTER — Other Ambulatory Visit: Payer: Self-pay

## 2023-01-01 ENCOUNTER — Observation Stay
Admission: RE | Admit: 2023-01-01 | Discharge: 2023-01-02 | Disposition: A | Discharge status: Home / Self Care | Payer: 59 | Attending: Surgery | Admitting: Surgery

## 2023-01-01 ENCOUNTER — Ambulatory Visit: Payer: 59 | Admitting: Certified Registered Nurse Anesthetist

## 2023-01-01 ENCOUNTER — Encounter
Admission: RE | Admit: 2023-01-01 | Discharge: 2023-01-01 | Disposition: A | Discharge status: Home / Self Care | Payer: 59 | Source: Ambulatory Visit | Attending: Surgery | Admitting: Surgery

## 2023-01-01 ENCOUNTER — Encounter
Admission: RE | Disposition: A | Discharge status: Home / Self Care | Payer: Self-pay | Source: Home / Self Care | Attending: Surgery

## 2023-01-01 DIAGNOSIS — C50811 Malignant neoplasm of overlapping sites of right female breast: Secondary | ICD-10-CM | POA: Diagnosis not present

## 2023-01-01 DIAGNOSIS — E039 Hypothyroidism, unspecified: Secondary | ICD-10-CM | POA: Diagnosis not present

## 2023-01-01 DIAGNOSIS — C50919 Malignant neoplasm of unspecified site of unspecified female breast: Secondary | ICD-10-CM | POA: Diagnosis present

## 2023-01-01 DIAGNOSIS — E119 Type 2 diabetes mellitus without complications: Secondary | ICD-10-CM | POA: Diagnosis not present

## 2023-01-01 DIAGNOSIS — Z421 Encounter for breast reconstruction following mastectomy: Secondary | ICD-10-CM | POA: Diagnosis not present

## 2023-01-01 DIAGNOSIS — C50311 Malignant neoplasm of lower-inner quadrant of right female breast: Secondary | ICD-10-CM

## 2023-01-01 DIAGNOSIS — Z79899 Other long term (current) drug therapy: Secondary | ICD-10-CM | POA: Diagnosis not present

## 2023-01-01 DIAGNOSIS — I10 Essential (primary) hypertension: Secondary | ICD-10-CM | POA: Diagnosis not present

## 2023-01-01 DIAGNOSIS — Z87891 Personal history of nicotine dependence: Secondary | ICD-10-CM | POA: Insufficient documentation

## 2023-01-01 DIAGNOSIS — Z170 Estrogen receptor positive status [ER+]: Secondary | ICD-10-CM | POA: Diagnosis not present

## 2023-01-01 HISTORY — PX: MASTECTOMY W/ SENTINEL NODE BIOPSY: SHX2001

## 2023-01-01 HISTORY — PX: BREAST RECONSTRUCTION WITH PLACEMENT OF TISSUE EXPANDER AND FLEX HD (ACELLULAR HYDRATED DERMIS): SHX6295

## 2023-01-01 LAB — GLUCOSE, CAPILLARY
Glucose-Capillary: 200 mg/dL — ABNORMAL HIGH (ref 70–99)
Glucose-Capillary: 162 mg/dL — ABNORMAL HIGH (ref 70–99)
Glucose-Capillary: 244 mg/dL — ABNORMAL HIGH (ref 70–99)

## 2023-01-01 SURGERY — MASTECTOMY WITH SENTINEL LYMPH NODE BIOPSY
Anesthesia: General | Site: Breast | Laterality: Right

## 2023-01-01 MED ORDER — ALPRAZOLAM 0.5 MG PO TABS: 0.5000 mg | ORAL_TABLET | Freq: Every evening | ORAL | Status: DC | PRN

## 2023-01-01 MED ORDER — PHENYLEPHRINE 80 MCG/ML (10ML) SYRINGE FOR IV PUSH (FOR BLOOD PRESSURE SUPPORT)
PREFILLED_SYRINGE | INTRAVENOUS | Status: DC | PRN
  Administered 2023-01-01 (×6): 80 ug via INTRAVENOUS

## 2023-01-01 MED ORDER — CELECOXIB 200 MG PO CAPS
ORAL_CAPSULE | ORAL | Status: AC
  Filled 2023-01-01: qty 1

## 2023-01-01 MED ORDER — FAMOTIDINE 20 MG PO TABS
ORAL_TABLET | ORAL | Status: AC
  Filled 2023-01-01: qty 1

## 2023-01-01 MED ORDER — CEFAZOLIN SODIUM-DEXTROSE 2-4 GM/100ML-% IV SOLN
2.0000 g | INTRAVENOUS | Status: AC
  Administered 2023-01-01: 2 g via INTRAVENOUS

## 2023-01-01 MED ORDER — CHLORHEXIDINE GLUCONATE CLOTH 2 % EX PADS
6.0000 | MEDICATED_PAD | Freq: Once | CUTANEOUS | Status: AC
  Administered 2023-01-01: 6 via TOPICAL

## 2023-01-01 MED ORDER — EPINEPHRINE PF 1 MG/ML IJ SOLN
INTRAMUSCULAR | Status: AC
  Filled 2023-01-01: qty 1

## 2023-01-01 MED ORDER — KETAMINE HCL 10 MG/ML IJ SOLN
INTRAMUSCULAR | Status: DC | PRN
  Administered 2023-01-01 (×2): 10 mg via INTRAVENOUS

## 2023-01-01 MED ORDER — OXYCODONE HCL 5 MG PO TABS
ORAL_TABLET | ORAL | Status: AC
  Filled 2023-01-01: qty 1

## 2023-01-01 MED ORDER — LEVOTHYROXINE SODIUM 50 MCG PO TABS
75.0000 ug | ORAL_TABLET | Freq: Every day | ORAL | Status: DC
  Administered 2023-01-02: 75 ug via ORAL
  Filled 2023-01-01: qty 2

## 2023-01-01 MED ORDER — LIDOCAINE HCL (CARDIAC) PF 100 MG/5ML IV SOSY
PREFILLED_SYRINGE | INTRAVENOUS | Status: DC | PRN
  Administered 2023-01-01: 80 mg via INTRAVENOUS

## 2023-01-01 MED ORDER — INSULIN ASPART 100 UNIT/ML IJ SOLN
3.0000 [IU] | Freq: Once | INTRAMUSCULAR | Status: AC
  Administered 2023-01-01: 3 [IU] via SUBCUTANEOUS

## 2023-01-01 MED ORDER — INSULIN ASPART 100 UNIT/ML IJ SOLN
0.0000 [IU] | Freq: Three times a day (TID) | INTRAMUSCULAR | Status: DC
  Administered 2023-01-02: 5 [IU] via SUBCUTANEOUS
  Filled 2023-01-01: qty 1

## 2023-01-01 MED ORDER — ORAL CARE MOUTH RINSE: 15.0000 mL | Freq: Once | OROMUCOSAL | Status: AC

## 2023-01-01 MED ORDER — STERILE WATER FOR IRRIGATION IR SOLN
Status: DC | PRN
  Administered 2023-01-01: 1000 mL

## 2023-01-01 MED ORDER — FENTANYL CITRATE (PF) 100 MCG/2ML IJ SOLN
25.0000 ug | INTRAMUSCULAR | Status: DC | PRN
  Administered 2023-01-01 (×2): 50 ug via INTRAVENOUS

## 2023-01-01 MED ORDER — BUPIVACAINE LIPOSOME 1.3 % IJ SUSP
INTRAMUSCULAR | Status: DC | PRN
  Administered 2023-01-01: 20 mL

## 2023-01-01 MED ORDER — SODIUM CHLORIDE 0.9 % IV SOLN: INTRAVENOUS | Status: DC

## 2023-01-01 MED ORDER — FAMOTIDINE 20 MG PO TABS
20.0000 mg | ORAL_TABLET | Freq: Once | ORAL | Status: AC
  Administered 2023-01-01: 20 mg via ORAL

## 2023-01-01 MED ORDER — SODIUM CHLORIDE FLUSH 0.9 % IV SOLN
INTRAVENOUS | Status: AC
  Filled 2023-01-01: qty 10

## 2023-01-01 MED ORDER — INSULIN ASPART 100 UNIT/ML IJ SOLN
INTRAMUSCULAR | Status: AC
  Filled 2023-01-01: qty 1

## 2023-01-01 MED ORDER — ACETAMINOPHEN 500 MG PO TABS
1000.0000 mg | ORAL_TABLET | ORAL | Status: AC
  Administered 2023-01-01: 1000 mg via ORAL

## 2023-01-01 MED ORDER — PROPOFOL 10 MG/ML IV BOLUS
INTRAVENOUS | Status: DC | PRN
  Administered 2023-01-01: 160 mg via INTRAVENOUS

## 2023-01-01 MED ORDER — CHLORHEXIDINE GLUCONATE 0.12 % MT SOLN
OROMUCOSAL | Status: AC
  Filled 2023-01-01: qty 15

## 2023-01-01 MED ORDER — FENTANYL CITRATE (PF) 100 MCG/2ML IJ SOLN
INTRAMUSCULAR | Status: DC | PRN
  Administered 2023-01-01: 25 ug via INTRAVENOUS
  Administered 2023-01-01: 50 ug via INTRAVENOUS
  Administered 2023-01-01: 25 ug via INTRAVENOUS

## 2023-01-01 MED ORDER — OXYCODONE HCL 5 MG/5ML PO SOLN: 5.0000 mg | Freq: Once | ORAL | Status: AC | PRN

## 2023-01-01 MED ORDER — CHLORHEXIDINE GLUCONATE CLOTH 2 % EX PADS: 6.0000 | MEDICATED_PAD | Freq: Once | CUTANEOUS | Status: DC

## 2023-01-01 MED ORDER — DROPERIDOL 2.5 MG/ML IJ SOLN
0.6250 mg | Freq: Once | INTRAMUSCULAR | Status: AC
  Administered 2023-01-01: 0.625 mg via INTRAVENOUS

## 2023-01-01 MED ORDER — VANCOMYCIN HCL 1000 MG IV SOLR
INTRAVENOUS | Status: AC
  Filled 2023-01-01: qty 20

## 2023-01-01 MED ORDER — CALCIUM CARBONATE ANTACID 500 MG PO CHEW: 2.0000 | CHEWABLE_TABLET | ORAL | Status: DC | PRN

## 2023-01-01 MED ORDER — DEXAMETHASONE SODIUM PHOSPHATE 10 MG/ML IJ SOLN
INTRAMUSCULAR | Status: DC | PRN
  Administered 2023-01-01: 10 mg via INTRAVENOUS

## 2023-01-01 MED ORDER — DROPERIDOL 2.5 MG/ML IJ SOLN
INTRAMUSCULAR | Status: AC
  Filled 2023-01-01: qty 2

## 2023-01-01 MED ORDER — ATORVASTATIN CALCIUM 20 MG PO TABS
20.0000 mg | ORAL_TABLET | Freq: Every day | ORAL | Status: DC
  Administered 2023-01-01: 20 mg via ORAL
  Filled 2023-01-01: qty 1

## 2023-01-01 MED ORDER — CEFAZOLIN SODIUM-DEXTROSE 2-4 GM/100ML-% IV SOLN: 2.0000 g | INTRAVENOUS | Status: DC

## 2023-01-01 MED ORDER — TRAMADOL HCL 50 MG PO TABS: 50.0000 mg | ORAL_TABLET | Freq: Four times a day (QID) | ORAL | Status: DC | PRN

## 2023-01-01 MED ORDER — SUGAMMADEX SODIUM 200 MG/2ML IV SOLN
INTRAVENOUS | Status: DC | PRN
  Administered 2023-01-01: 200 mg via INTRAVENOUS

## 2023-01-01 MED ORDER — MIDAZOLAM HCL 2 MG/2ML IJ SOLN
INTRAMUSCULAR | Status: AC
  Filled 2023-01-01: qty 2

## 2023-01-01 MED ORDER — ROCURONIUM BROMIDE 100 MG/10ML IV SOLN
INTRAVENOUS | Status: DC | PRN
  Administered 2023-01-01: 70 mg via INTRAVENOUS
  Administered 2023-01-01: 20 mg via INTRAVENOUS

## 2023-01-01 MED ORDER — ONDANSETRON 4 MG PO TBDP: 4.0000 mg | ORAL_TABLET | Freq: Four times a day (QID) | ORAL | Status: DC | PRN

## 2023-01-01 MED ORDER — BUPIVACAINE HCL (PF) 0.5 % IJ SOLN
INTRAMUSCULAR | Status: DC | PRN
  Administered 2023-01-01: 30 mL

## 2023-01-01 MED ORDER — FENTANYL CITRATE (PF) 100 MCG/2ML IJ SOLN
INTRAMUSCULAR | Status: AC
  Filled 2023-01-01: qty 2

## 2023-01-01 MED ORDER — OXYCODONE-ACETAMINOPHEN 5-325 MG PO TABS: 1.0000 | ORAL_TABLET | Freq: Four times a day (QID) | ORAL | Status: DC | PRN

## 2023-01-01 MED ORDER — ACETAMINOPHEN 500 MG PO TABS
500.0000 mg | ORAL_TABLET | Freq: Four times a day (QID) | ORAL | Status: DC | PRN
  Administered 2023-01-02: 500 mg via ORAL
  Filled 2023-01-01: qty 1

## 2023-01-01 MED ORDER — MORPHINE SULFATE (PF) 2 MG/ML IV SOLN: 1.0000 mg | INTRAVENOUS | Status: DC | PRN

## 2023-01-01 MED ORDER — HEMOSTATIC AGENTS (NO CHARGE) OPTIME
TOPICAL | Status: DC | PRN
  Administered 2023-01-01: 1 via TOPICAL

## 2023-01-01 MED ORDER — ACETAMINOPHEN 500 MG PO TABS
ORAL_TABLET | ORAL | Status: AC
  Filled 2023-01-01: qty 2

## 2023-01-01 MED ORDER — KETAMINE HCL 50 MG/5ML IJ SOSY
PREFILLED_SYRINGE | INTRAMUSCULAR | Status: AC
  Filled 2023-01-01: qty 5

## 2023-01-01 MED ORDER — GABAPENTIN 300 MG PO CAPS
ORAL_CAPSULE | ORAL | Status: AC
  Filled 2023-01-01: qty 1

## 2023-01-01 MED ORDER — PHENYLEPHRINE HCL-NACL 20-0.9 MG/250ML-% IV SOLN
INTRAVENOUS | Status: AC
  Filled 2023-01-01: qty 250

## 2023-01-01 MED ORDER — CEFAZOLIN SODIUM-DEXTROSE 2-4 GM/100ML-% IV SOLN
2.0000 g | Freq: Three times a day (TID) | INTRAVENOUS | Status: DC
  Administered 2023-01-01 – 2023-01-02 (×2): 2 g via INTRAVENOUS
  Filled 2023-01-01 (×3): qty 100

## 2023-01-01 MED ORDER — CEFAZOLIN SODIUM-DEXTROSE 2-4 GM/100ML-% IV SOLN
INTRAVENOUS | Status: AC
  Filled 2023-01-01: qty 100

## 2023-01-01 MED ORDER — BUPIVACAINE LIPOSOME 1.3 % IJ SUSP
INTRAMUSCULAR | Status: AC
  Filled 2023-01-01: qty 20

## 2023-01-01 MED ORDER — IBUPROFEN 400 MG PO TABS: 200.0000 mg | ORAL_TABLET | Freq: Four times a day (QID) | ORAL | Status: DC | PRN

## 2023-01-01 MED ORDER — PROPOFOL 10 MG/ML IV BOLUS
INTRAVENOUS | Status: AC
  Filled 2023-01-01: qty 20

## 2023-01-01 MED ORDER — BUPIVACAINE HCL (PF) 0.25 % IJ SOLN
INTRAMUSCULAR | Status: AC
  Filled 2023-01-01: qty 30

## 2023-01-01 MED ORDER — MIDAZOLAM HCL 2 MG/2ML IJ SOLN
INTRAMUSCULAR | Status: DC | PRN
  Administered 2023-01-01: 2 mg via INTRAVENOUS

## 2023-01-01 MED ORDER — CELECOXIB 200 MG PO CAPS
200.0000 mg | ORAL_CAPSULE | ORAL | Status: AC
  Administered 2023-01-01: 200 mg via ORAL

## 2023-01-01 MED ORDER — BUPIVACAINE HCL (PF) 0.5 % IJ SOLN
INTRAMUSCULAR | Status: AC
  Filled 2023-01-01: qty 30

## 2023-01-01 MED ORDER — VANCOMYCIN HCL 1000 MG IV SOLR
INTRAVENOUS | Status: DC | PRN
  Administered 2023-01-01: 1000 mg

## 2023-01-01 MED ORDER — GABAPENTIN 300 MG PO CAPS
300.0000 mg | ORAL_CAPSULE | ORAL | Status: AC
  Administered 2023-01-01: 300 mg via ORAL

## 2023-01-01 MED ORDER — NEOMYCIN-POLYMYXIN B GU 40-200000 IR SOLN
Status: AC
  Filled 2023-01-01: qty 20

## 2023-01-01 MED ORDER — ONDANSETRON HCL 4 MG/2ML IJ SOLN
INTRAMUSCULAR | Status: DC | PRN
  Administered 2023-01-01: 4 mg via INTRAVENOUS

## 2023-01-01 MED ORDER — TECHNETIUM TC 99M TILMANOCEPT KIT
1.0680 | PACK | Freq: Once | INTRAVENOUS | Status: AC | PRN
  Administered 2023-01-01: 1.068 via INTRADERMAL

## 2023-01-01 MED ORDER — ONDANSETRON HCL 4 MG/2ML IJ SOLN
4.0000 mg | Freq: Four times a day (QID) | INTRAMUSCULAR | Status: DC | PRN
  Administered 2023-01-01: 4 mg via INTRAVENOUS
  Filled 2023-01-01: qty 2

## 2023-01-01 MED ORDER — CHLORHEXIDINE GLUCONATE 0.12 % MT SOLN
15.0000 mL | Freq: Once | OROMUCOSAL | Status: AC
  Administered 2023-01-01: 15 mL via OROMUCOSAL

## 2023-01-01 MED ORDER — OXYCODONE HCL 5 MG PO TABS
5.0000 mg | ORAL_TABLET | Freq: Once | ORAL | Status: AC | PRN
  Administered 2023-01-01: 5 mg via ORAL

## 2023-01-01 SURGICAL SUPPLY — 95 items
ADH SKN CLS APL DERMABOND .7 (GAUZE/BANDAGES/DRESSINGS) ×1
APL PRP STRL LF DISP 70% ISPRP (MISCELLANEOUS)
APPLIER CLIP 11 MED OPEN (CLIP)
APR CLP MED 11 20 MLT OPN (CLIP)
BAG DECANTER FOR FLEXI CONT (MISCELLANEOUS) ×1 IMPLANT
BINDER BREAST XLRG (GAUZE/BANDAGES/DRESSINGS) IMPLANT
BIOPATCH WHT 1IN DISK W/4.0 H (GAUZE/BANDAGES/DRESSINGS) ×2 IMPLANT
BLADE BOVIE TIP EXT 4 (BLADE) ×1 IMPLANT
BLADE PHOTON ILLUMINATED (MISCELLANEOUS) ×1 IMPLANT
BLADE SURG 15 STRL LF DISP TIS (BLADE) ×2 IMPLANT
BLADE SURG 15 STRL SS (BLADE) ×2
BNDG GAUZE DERMACEA FLUFF 4 (GAUZE/BANDAGES/DRESSINGS) ×2 IMPLANT
BNDG GZE DERMACEA 4 6PLY (GAUZE/BANDAGES/DRESSINGS)
BULB RESERV EVAC DRAIN JP 100C (MISCELLANEOUS) ×2 IMPLANT
CHLORAPREP W/TINT 26 (MISCELLANEOUS) ×1 IMPLANT
CLIP APPLIE 11 MED OPEN (CLIP) IMPLANT
CNTNR URN SCR LID CUP LEK RST (MISCELLANEOUS) IMPLANT
CONT SPEC 4OZ STRL OR WHT (MISCELLANEOUS)
COVER PROBE GAMMA FINDER SLV (MISCELLANEOUS) ×1 IMPLANT
DERMABOND ADVANCED .7 DNX12 (GAUZE/BANDAGES/DRESSINGS) ×3 IMPLANT
DRAIN CHANNEL JP 15F RND 16 (MISCELLANEOUS) IMPLANT
DRAIN CHANNEL JP 19F (MISCELLANEOUS) ×2 IMPLANT
DRAPE LAPAROTOMY TRNSV 106X77 (MISCELLANEOUS) ×1 IMPLANT
DRSG GAUZE FLUFF 36X18 (GAUZE/BANDAGES/DRESSINGS) ×1 IMPLANT
DRSG MEPILEX FLEX 6X6 (GAUZE/BANDAGES/DRESSINGS) ×2 IMPLANT
DRSG TEGADERM 2-3/8X2-3/4 SM (GAUZE/BANDAGES/DRESSINGS) ×2 IMPLANT
ELECT CAUTERY BLADE 6.4 (BLADE) ×1 IMPLANT
ELECT CAUTERY BLADE TIP 2.5 (TIP) ×1
ELECT REM PT RETURN 9FT ADLT (ELECTROSURGICAL) ×1
ELECTRODE CAUTERY BLDE TIP 2.5 (TIP) ×1 IMPLANT
ELECTRODE REM PT RTRN 9FT ADLT (ELECTROSURGICAL) ×1 IMPLANT
GAUZE 4X4 16PLY ~~LOC~~+RFID DBL (SPONGE) ×2 IMPLANT
GAUZE SPONGE 4X4 12PLY STRL (GAUZE/BANDAGES/DRESSINGS) IMPLANT
GLOVE BIO SURGEON STRL SZ 6.5 (GLOVE) ×4 IMPLANT
GLOVE BIOGEL PI IND STRL 7.0 (GLOVE) ×1 IMPLANT
GLOVE SURG SYN 6.5 ES PF (GLOVE) ×1 IMPLANT
GLOVE SURG SYN 6.5 PF PI (GLOVE) ×1 IMPLANT
GOWN STRL REUS W/ TWL LRG LVL3 (GOWN DISPOSABLE) ×6 IMPLANT
GOWN STRL REUS W/TWL LRG LVL3 (GOWN DISPOSABLE) ×5
HEMOSTAT ARISTA ABSORB 3G PWDR (HEMOSTASIS) IMPLANT
IMPL EXPANDER BREAST 650CC (Breast) IMPLANT
IMPLANT EXPANDER BREAST 650CC (Breast) ×1 IMPLANT
IV NS 1000ML (IV SOLUTION)
IV NS 1000ML BAXH (IV SOLUTION) IMPLANT
IV NS 500ML (IV SOLUTION)
IV NS 500ML BAXH (IV SOLUTION) IMPLANT
KIT FILL ASEPTIC TRANSFER (MISCELLANEOUS) ×1 IMPLANT
KIT MARKER MARGIN INK (KITS) IMPLANT
KIT TURNOVER KIT A (KITS) ×1 IMPLANT
LABEL OR SOLS (LABEL) ×1 IMPLANT
LIGHT WAVEGUIDE WIDE FLAT (MISCELLANEOUS) IMPLANT
MANIFOLD NEPTUNE II (INSTRUMENTS) ×2 IMPLANT
NDL FILTER BLUNT 18X1 1/2 (NEEDLE) ×2 IMPLANT
NDL HYPO 22X1.5 SAFETY MO (MISCELLANEOUS) ×2 IMPLANT
NEEDLE FILTER BLUNT 18X1 1/2 (NEEDLE) ×1 IMPLANT
NEEDLE HYPO 22X1.5 SAFETY MO (MISCELLANEOUS) ×2 IMPLANT
PACK BASIN MAJOR ARMC (MISCELLANEOUS) ×1 IMPLANT
PACK BASIN MINOR ARMC (MISCELLANEOUS) ×1 IMPLANT
PACK SPY-PHI (KITS) IMPLANT
PACK UNIVERSAL (MISCELLANEOUS) IMPLANT
PAD ABD DERMACEA PRESS 5X9 (GAUZE/BANDAGES/DRESSINGS) ×4 IMPLANT
PIN SAFETY STRL (MISCELLANEOUS) ×1 IMPLANT
SOL PREP PVP 2OZ (MISCELLANEOUS)
SOLUTION PREP PVP 2OZ (MISCELLANEOUS) ×2 IMPLANT
SPIKE FLUID TRANSFER (MISCELLANEOUS) IMPLANT
SPONGE T-LAP 18X18 ~~LOC~~+RFID (SPONGE) ×6 IMPLANT
STAPLER SKIN PROX 35W (STAPLE) IMPLANT
STRIP CLOSURE SKIN 1/2X4 (GAUZE/BANDAGES/DRESSINGS) ×2 IMPLANT
SUT ETHILON 3-0 FS-10 30 BLK (SUTURE) ×1
SUT MNCRL 3-0 UNDYED SH (SUTURE) ×2 IMPLANT
SUT MNCRL 4-0 (SUTURE) ×2
SUT MNCRL 4-0 27XMFL (SUTURE) ×2
SUT MNCRL+ 5-0 UNDYED PC-3 (SUTURE) ×2 IMPLANT
SUT PDS II 3-0 (SUTURE) IMPLANT
SUT PDS PLUS AB 2-0 CT-1 (SUTURE) ×6 IMPLANT
SUT SILK 2 0 (SUTURE) ×1
SUT SILK 2 0 SH (SUTURE) ×1 IMPLANT
SUT SILK 2-0 30XBRD TIE 12 (SUTURE) ×1 IMPLANT
SUT SILK 3 0 12 30 (SUTURE) ×1 IMPLANT
SUT SILK 4 0 SH (SUTURE) ×2 IMPLANT
SUT VIC AB 2-0 SH 27 (SUTURE)
SUT VIC AB 2-0 SH 27XBRD (SUTURE) ×1 IMPLANT
SUT VIC AB 3-0 SH 27 (SUTURE)
SUT VIC AB 3-0 SH 27X BRD (SUTURE) ×4 IMPLANT
SUTURE EHLN 3-0 FS-10 30 BLK (SUTURE) IMPLANT
SUTURE MNCRL 4-0 27XMF (SUTURE) ×3 IMPLANT
SYR 10ML LL (SYRINGE) ×3 IMPLANT
SYR 20ML LL LF (SYRINGE) ×2 IMPLANT
SYR BULB IRRIG 60ML STRL (SYRINGE) ×2 IMPLANT
TISSUE FLEXHD PERF PLIAB 8X16 (Tissue) IMPLANT
TOWEL OR 17X26 4PK STRL BLUE (TOWEL DISPOSABLE) ×1 IMPLANT
TRAP FLUID SMOKE EVACUATOR (MISCELLANEOUS) ×2 IMPLANT
TUBING CONNECTING 10 (TUBING) ×1 IMPLANT
WATER STERILE IRR 1000ML POUR (IV SOLUTION) ×1 IMPLANT
WATER STERILE IRR 500ML POUR (IV SOLUTION) ×2 IMPLANT

## 2023-01-01 NOTE — Interval H&P Note (Signed)
No change. Ok to proceed

## 2023-01-01 NOTE — Progress Notes (Signed)
Patient received from PACU arousable. Spouse at bedside. VS stable. Dressing to r breast CDI.  Tina Payne
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
## 2023-01-01 NOTE — H&P (Signed)
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 female who was referred by Areta Haber, NP for evaluation of above.   Noted palpable mass. Subsequent mammo and biopsy results as noted below. Already seen oncology.  Past Medical History: has a past medical history of Alcohol abuse, Colon polyp, Diabetes (CMS/HHS-HCC), GERD (gastroesophageal reflux disease), Hypertension, Hypothyroidism, Smoking, and Type 2 diabetes mellitus (CMS/HHS-HCC).  Past Surgical History: has a past surgical history that includes colonoscopy (10/25/2009) and Right salpingectomy (1984).  Family History: family history includes Cancer in her brother; Lung cancer in her father and mother.  Social History: reports that she has quit smoking. Her smoking use included cigarettes. She has a 16 pack-year smoking history. She has quit using smokeless tobacco. She reports current alcohol use. She reports that she does not use drugs.  Current Medications: has a current medication list which includes the following prescription(s): atorvastatin, cholecalciferol, dulaglutide, levothyroxine, metformin, blood glucose diagnostic, blood glucose meter, lancing device with lancets, and lidocaine-prilocaine.  Allergies:  Allergies as of 11/25/2022  (No Known Allergies)   ROS:  A 15 point review of systems was performed and was negative except as noted in HPI  Objective:    BP 113/71  Pulse 76  Ht 170.2 cm (5\' 7" )  Wt 85.7 kg (189 lb)  BMI 29.60 kg/m   Constitutional : No distress, cooperative, alert  Lymphatics/Throat: Supple with no lymphadenopathy  Respiratory: Clear to auscultation bilaterally  Cardiovascular: Regular rate and rhythm  Gastrointestinal: Soft, non-tender, non-distended, no organomegaly.  Musculoskeletal: Steady gait and movement  Skin: Cool and moist  Psychiatric: Normal  affect, non-agitated, not confused  Breast: Normal appearance and no palpable abnormality in bilateral breasts and axilla. Chaperone present for exam.    LABS: SURGICAL PATHOLOGY SURGICAL PATHOLOGY CASE: [id number] PATIENT: Tina Payne Surgical Pathology Report  Specimen Submitted: A. Breast, 3:00, 7 cm B. Breast, right, 2:00, 4 cm C. Breast, right, 2:00, 4 cm FN lateral  Clinical History: Palpable right breast mass. Multiple ill defined masses in the right upper inner breast, Sonographically identified. Site 1: distinct from site 2/3. Site 2 and 3 sampling 2 areas of a 27 mm ill defined area with subtle distortion on mammo. Site 1: fibroadenomatoid change, malignancy. Remote benign biopsy. A - Ribbon-shaped clip placed following ultrasound guided biopsy of RIGHT breast at 3 o'clock, 7 cm fn. B - Coil-shaped clip placed following ultrasound guided biopsy of RIGHT breast at 2 o'clock, 4 cm fn, medial aspect. C - Venus-shaped clip placed following ultrasound guided biopsy of RIGHT breast at 2 o'clock, 4 cm fn, lateral aspect.  DIAGNOSIS: A. BREAST, RIGHT AT 3:00, 7 CM FROM NIPPLE; ULTRASOUND-GUIDED CORE NEEDLE BIOPSY (RIBBON CLIP): - DUCTAL CARCINOMA IN SITU, HIGH-GRADE, WITH COMEDONECROSIS. - NEGATIVE FOR INVASIVE CARCINOMA.  Comment: DCIS is present in 2 of 2 tissue blocks, measuring up to 6 mm in greatest linear extent.  B. BREAST, RIGHT AT 2:00, 4 CM FROM THE NIPPLE, MEDIAL ASPECT; ULTRASOUND-GUIDED CORE NEEDLE BIOPSY (COIL CLIP): - MICROINVASIVE MAMMARY CARCINOMA, NO SPECIAL TYPE.  Size of invasive carcinoma: 0.3 mm in this sample Histologic grade of invasive carcinoma: Grade at least 2 Glandular/tubular differentiation score: 2 Nuclear pleomorphism score: 3 Mitotic rate score: At least 1 Total score: At least 6 Ductal carcinoma in situ: Present, high-grade with comedonecrosis Lymphovascular invasion: Not identified  C. BREAST, RIGHT AT 2:00, 4 CM FROM THE  NIPPLE,  LATERAL ASPECT; ULTRASOUND-GUIDED CORE NEEDLE BIOPSY (VENUS CLIP): - MICROINVASIVE MAMMARY CARCINOMA, NO SPECIAL TYPE.  Size of invasive carcinoma: 0.2 mm in this sample Histologic grade of invasive carcinoma: Grade at least 2 Glandular/tubular differentiation score: 2 Nuclear pleomorphism score: 3 Mitotic rate score: At least 1 Total score: At least 6 Ductal carcinoma in situ: Present, high-grade with comedonecrosis Lymphovascular invasion: Not identified  Comment: On initial evaluation, there were focal areas in each specimen that appeared suspicious for microinvasive carcinoma, in a background of extensive high grade ductal carcinoma in situ. Immunohistochemical studies directed against myoepithelial markers p63 and calponin were performed on selected blocks for each source. Source A demonstrates intact myoepithelial staining, compatible with DCIS. However, in sources B and C, there are focal areas demonstrating absence of myoepithelial marker, compatible with microinvasive carcinoma. In addition, in part C, there is an area of disrupted DCIS without definite myoepithelial marking, however, DCIS is still favored, with lack of staining secondary to tissue disruption.  Definitive grading will be assigned on the final resection.  ER/PR/HER2: Immunohistochemistry will be deferred to the final excision, given the limited amount of tumor present  IHC slides were prepared by [facility] [place]. All controls stained appropriately.  This test was developed and its performance characteristics determined by [facility]. It has not been cleared or approved by the [place] Food and Drug Administration. The FDA does not require this test to go through premarket FDA review. This test is used for clinical purposes. It should not be regarded as investigational or for research. This laboratory is certified under the Clinical Laboratory Improvement Amendments (CLIA) as qualified to perform  high complexity clinical laboratory testing.  GROSS DESCRIPTION: A. Labeled: Right breast 3:00 7 cm from nipple Received: Formalin Time/date in fixative: Collected and placed in formalin at 1:34 PM on 11/19/2022 Cold ischemic time: Less than 1 minute Total fixation time: Approximately 6.75 hours Core pieces: 5 cores and 1 additional fragment Size: Range from 0.5-1.7 cm in length and 0.2 cm in diameter Description: Received are cores and a fragment of pink-yellow focally hemorrhagic fibrofatty tissue. The additional fragment is 0.3 x 0.2 x 0.1 cm in aggregate. Ink color: Green Entirely submitted in cassettes 1-2 with 3 cores in cassette 1 and 2 cores with the remaining fragment in cassette 2.  B. Labeled: Right breast 2:00 4 cm from nipple Received: Formalin Time/date in fixative: Collected and placed in formalin at 1:44 PM on 11/19/2022 Cold ischemic time: Less than 1 minute Total fixation time: Approximately 6.5 hours Core pieces: 4 cores and multiple additional fragments Size: Range from 0.6-1.4 cm in length and 0.2 cm in diameter Description: Received are cores and fragments of yellow focally hemorrhagic fibrofatty tissue. The additional fragments are 0.6 x 0.3 x 0.1 cm in aggregate. Ink color: Black Entirely submitted in cassettes 1-2 with 3 cores in cassette 1 and 1 core with the remaining fragments in cassette 2.  C. Labeled: Right breast 2:00 4 cm from nipple Received: Formalin Time/date in fixative: Collected and placed in formalin at 1:50 PM on 11/19/2022 Cold ischemic time: Less than 1 minute Total fixation time: Approximately 6.5 hours Core pieces: 4 Size: Range from 1.4-1.5 cm in length and 0.2 cm in diameter Description: Received are cores of yellow-pink, focally hemorrhagic, fibrofatty tissue. Ink color: Blue Entirely submitted in cassettes 1-2 with 2 cores in cassette 1 and 2 cores in cassette 2.  RB 11/19/2022  Final Diagnosis performed by Katherine Mantle, MD.  Electronically signed 11/21/2022 10:23:35AM The  electronic signature indicates that the named Attending Pathologist has evaluated the specimen Technical component performed at [facility], [address] [place], [place] [address] Lab: [phone] Dir: Jolene Schimke, MD, MMM Professional component performed at [facility], [facility], [address] [place], [place], [place] [address] Lab: [id number] Dir: Beryle Quant, MD    RADS: Addendum by Glennon Mac, MD on 11/24/2022 1:10 PM EDT  ADDENDUM REPORT: 11/24/2022 11:09   ADDENDUM:  PATHOLOGY revealed: Site A. BREAST, RIGHT AT 3:00, 7 CM FROM NIPPLE;  ULTRASOUND-GUIDED CORE NEEDLE BIOPSY - (RIBBON CLIP): - DUCTAL  CARCINOMA IN SITU, HIGH-GRADE, WITH COMEDONECROSIS. - NEGATIVE FOR  INVASIVE CARCINOMA. Comment: DCIS is present in 2 of 2 tissue  blocks, measuring up to 6 mm in greatest linear extent.   Pathology results are CONCORDANT with imaging findings, per Dr.  Meda Klinefelter.   PATHOLOGY revealed: Site B. BREAST, RIGHT AT 2:00, 4 CM FROM THE  NIPPLE, MEDIAL ASPECT; ULTRASOUND-GUIDED CORE NEEDLE BIOPSY (COIL  CLIP): - MICROINVASIVE MAMMARY CARCINOMA, NO SPECIAL TYPE. 0.3 mm in  this sample. Grade at least 2. Ductal carcinoma in situ: Present,  high-grade with comedonecrosis. Lymphovascular invasion: Not  identified.   Pathology results are CONCORDANT with imaging findings, per Dr.  Meda Klinefelter.   PATHOLOGY revealed: Site C. BREAST, RIGHT AT 2:00, 4 CM FROM THE  NIPPLE, LATERAL ASPECT; ULTRASOUND-GUIDED CORE NEEDLE BIOPSY (VENUS  CLIP): - MICROINVASIVE MAMMARY CARCINOMA, NO SPECIAL TYPE. 0.2 mm in  this sample. Grade at least 2. Ductal carcinoma in situ: Present,  high-grade with comedonecrosis. Lymphovascular invasion: Not  identified. COMMENT: On initial evaluation, there were focal areas  in each specimen that appeared suspicious for microinvasive  carcinoma, in a background of extensive high grade  ductal carcinoma  in situ. Immunohistochemical studies directed against myoepithelial  markers p63 and calponin were performed on selected blocks for each  source. Source A demonstrates intact myoepithelial staining,  compatible with DCIS. However, in sources B and C, there are focal  areas demonstrating absence of myoepithelial marker, compatible with  microinvasive carcinoma. In addition, in part C, there is an area of  disrupted DCIS without definite myoepithelial marking, however, DCIS  is still favored, with lack of staining secondary to tissue  disruption.   Pathology results are CONCORDANT with imaging findings, per Dr.  Meda Klinefelter.   Pathology results and recommendations below were discussed with  patient by telephone on 11/21/2022. Patient reported biopsy site  within normal limits with slight tenderness at the site. Post biopsy  care instructions were reviewed, questions were answered and my  direct phone number was provided to patient. Patient was instructed  to call [facility] if any concerns or questions arise  related to the biopsy.   RECOMMENDATIONS:   1. Surgical and oncological consultation. Request for surgical and  oncological consultation relayed to Irving Shows RN at [facility] by Randa Lynn RN on 11/21/2022.   2. Recommend pretreatment bilateral breast MRI with and without  contrast to evaluate extent of breast disease. NOTE: Biopsy clips  span at least 4.1 cm.   Pathology results reported by Randa Lynn RN on 11/24/2022.   Electronically Signed  By: Meda Klinefelter M.D.  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 Pt specifically requested we minimize risk of additional procedures, so this approach will minimize issues with positive margins.  Plan:    1. 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 The risks of general anesthetic, if used, includes MI, CVA, sudden death or even reaction to anesthetic medications also discussed.  Typical post-op recovery time and possbility of activity restrictions were also discussed. Alternatives include continued observation. Benefits include possible symptom relief, pathologic evaluation, and/or curative excision.   The patient verbalized understanding and all questions were answered to the patient's satisfaction.  2. Patient has elected to proceed with surgical treatment. Procedure will be scheduled. RIGHT mastectomy with SLNB. To be done after hormone marker and genetic testing completed.  labs/images/medications/previous chart entries reviewed personally and relevant changes/updates noted above.

## 2023-01-01 NOTE — Interval H&P Note (Signed)
History and Physical Interval Note:  01/01/2023 10:55 AM  Tina Payne  has presented today for surgery, with the diagnosis of malignant neoplasm of lower-inner quadrant of right female breast, unsepcified estrogen receptor status C50.311.  The various methods of treatment have been discussed with the patient and family. After consideration of risks, benefits and other options for treatment, the patient has consented to  Procedure(s): MASTECTOMY WITH SENTINEL LYMPH NODE BIOPSY (Right) BREAST RECONSTRUCTION WITH PLACEMENT OF TISSUE EXPANDER AND FLEX HD (ACELLULAR HYDRATED DERMIS) (Right) as a surgical intervention.  The patient's history has been reviewed, patient examined, no change in status, stable for surgery.  I have reviewed the patient's chart and labs.  Questions were answered to the patient's satisfaction.     Alena Bills [name]

## 2023-01-01 NOTE — Transfer of Care (Signed)
Immediate Anesthesia Transfer of Care Note  Patient: Tina Payne  Procedure(s) Performed: MASTECTOMY WITH SENTINEL LYMPH NODE BIOPSY (Right: Breast) BREAST RECONSTRUCTION WITH PLACEMENT OF TISSUE EXPANDER AND FLEX HD (ACELLULAR HYDRATED DERMIS) (Right: Breast)  Patient Location: PACU  Anesthesia Type:General  Level of Consciousness: awake and drowsy  Airway & Oxygen Therapy: Patient Spontanous Breathing and Patient connected to face mask oxygen  Post-op Assessment: Post -op Vital signs reviewed and stable  Post vital signs: Reviewed and stable  Last Vitals:  Vitals Value Taken Time  BP 142/80 01/01/23 1436  Temp    Pulse 68 01/01/23 1440  Resp 14 01/01/23 1440  SpO2 98 % 01/01/23 1440  Vitals shown include unvalidated device data.  Last Pain:  Vitals:   01/01/23 0948  TempSrc: Temporal  PainSc: 0-No pain         Complications: No notable events documented.

## 2023-01-01 NOTE — Discharge Instructions (Signed)

## 2023-01-01 NOTE — Op Note (Addendum)
Op report    DATE OF OPERATION:  01/01/2023  LOCATION: [facility]  SURGICAL DIVISION: Plastic Surgery  PREOPERATIVE DIAGNOSES:  1. Right Breast cancer.    POSTOPERATIVE DIAGNOSES:  1. Right Breast cancer.   PROCEDURE:  1. Right immediate breast reconstruction with placement of Acellular Dermal Matrix and tissue expanders.  SURGEON: Foster Simpson, DO  ASSISTANT: Burna Forts, PA  ANESTHESIA:  General.   COMPLICATIONS: None.   IMPLANTS: Right - Mentor 650 cc. Ref #[id number].  Serial Number [id number], 200 cc of injectable saline placed in the expander. Acellular Dermal Matrix 8 x 16 cm Flex HD  INDICATIONS FOR PROCEDURE:  The patient, Tina Payne, is a 57 y.o. female born on June 28, 1966, is here for  immediate first stage breast reconstruction with placement of a right tissue expander and Acellular dermal matrix. MRN: [id number]  CONSENT:  Informed consent was obtained directly from the patient. Risks, benefits and alternatives were fully discussed. Specific risks including but not limited to bleeding, infection, hematoma, seroma, scarring, pain, implant infection, implant extrusion, capsular contracture, asymmetry, wound healing problems, and need for further surgery were all discussed. The patient did have an ample opportunity to have her questions answered to her satisfaction.   DESCRIPTION OF PROCEDURE:  The patient was taken to the operating room by the general surgery team. SCDs were placed and IV antibiotics were given. The patient's chest was prepped and draped in a sterile fashion. A time out was performed and the implants to be used were identified.  A right mastectomy was performed.  Once the general surgery team had completed their portion of the case the patient was rendered to the plastic and reconstructive surgery team.  Right:  The pectoralis major muscle was lifted from the chest wall with release of the lateral edge and lateral inframammary  fold.  The pocket was irrigated with antibiotic solution and hemostasis was achieved with electrocautery.  The ADM was then prepared according to the manufacture guidelines and slits placed to help with postoperative fluid management.  The ADM was then sutured to the inferior and lateral edge of the inframammary fold with 2-0 PDS starting with an interrupted stitch and then a running stitch.  The lateral portion was sutured to with interrupted sutures after the expander was placed.  The expander was prepared according to the manufacture guidelines, the air evacuated and then it was placed under the ADM and pectoralis major muscle.  The inferior and lateral tabs were used to secure the expander to the chest wall with 2-0 PDS.  The drain was placed at the inframammary fold over the ADM and secured to the skin with 3-0 Silk.  Arista was placed in the pocket.  The deep layers were closed with 3-0 PDS followed by 4-0 Monocryl.  The dermabond was applied.  The ABDs and breast binder were placed.  The patient tolerated the procedure well and there were no complications.  The patient was allowed to wake from anesthesia and taken to the recovery room in satisfactory condition.   The advanced practice practitioner (APP) assisted throughout the case.  The APP was essential in retraction and counter traction when needed to make the case progress smoothly.  This retraction and assistance made it possible to see the tissue plans for the procedure.  The assistance was needed for blood control, tissue re-approximation and assisted with closure of the incision site.

## 2023-01-01 NOTE — Anesthesia Procedure Notes (Signed)
Procedure Name: Intubation Date/Time: 01/01/2023 12:25 PM  Performed by: Joanette Gula, Esmeralda Malay, CRNAPre-anesthesia Checklist: Patient identified, Emergency Drugs available, Suction available and Patient being monitored Patient Re-evaluated:Patient Re-evaluated prior to induction Oxygen Delivery Method: Circle system utilized Preoxygenation: Pre-oxygenation with 100% oxygen Induction Type: IV induction Ventilation: Mask ventilation without difficulty Laryngoscope Size: McGraph and 3 Grade View: Grade I Tube type: Oral Tube size: 7.0 mm Number of attempts: 1 Airway Equipment and Method: Stylet Placement Confirmation: ETT inserted through vocal cords under direct vision, positive ETCO2 and breath sounds checked- equal and bilateral Secured at: 20 cm Tube secured with: Tape Dental Injury: Teeth and Oropharynx as per pre-operative assessment

## 2023-01-01 NOTE — Op Note (Signed)
Preoperative diagnosis: Right breast Cancer.  Postoperative diagnosis: same.   Procedure: Right simple mastectomy. SLNB  Anesthesia: GETA  Surgeon: Dr. Tonna Boehringer  Wound Classification: Clean  Specimen: Right breast  Complications: None  Estimated Blood Loss: 25 mL   Indications: Patient is a 57 y.o. female who had an abnormal mammogram that on workup with core needle biopsy was found to be cancer. After discussion of alternatives, the patient elected (simple) mastectomy.  Operation performed with curative intent:Yes  Tracer(s) used to identify sentinel nodes in the upfront surgery (non-neoadjuvant) setting (select all that apply):Radioactive Tracer  Tracer(s) used to identify sentinel nodes in the neoadjuvant setting (select all that apply):N/A  All nodes (colored or non-colored) present at the end of a dye-filled lymphatic channel were removed:N/A  All significantly radioactive nodes were removed:Yes  All palpable suspicious nodes were removed:Yes  Biopsy-proven positive nodes marked with clips prior to chemotherapy were identified and removed:N/A   Description of procedure: The patient was brought to the operating room and general anesthesia was induced. A time-out was completed verifying correct patient, procedure, site, positioning, and implant(s) and/or special equipment prior to beginning this procedure. The breast, chest wall, axilla, and upper arm and neck were prepped and draped in the usual sterile fashion.  A skin incision was made that encompassed the nipple-areola complex and the previous biopsy scar and passed in an oblique direction across the breast.  Hand-held gamma probe was used to identify the location of the hottest spot in the axilla, thorough the lateral edge of the original incision.  Sharp and blunt Dissection was carried down to subdermal facias. The probe was placed within wound and again, the point of maximal count was found. Dissection continue until  nodule was identified. The probe was placed in contact with the node and 5600 counts were recorded. The node was excised in its entirety. Ex vivo, the node measured 6200 counts when placed on the probe. The bed of the node measured 100 counts. One additional hot spot was detected and the node was excised in similar fashion. No additional hot spots were identified. One clinically abnormal nodes palpated and removed.  All sent for to pathology.  Skin flaps were raised in the avascular plane between subcutaneous tissue and breast tissue from the clavicle superiorly, the sternum medially, the anterior rectus sheath inferiorly, and past the lateral border of the pectoralis major muscle laterally. Hemostasis was achieved in the flaps. Next, the breast tissue and underlying pectoralis fascia were excised from the pectoralis major muscle, progressing from medially to laterally. At the lateral border of the pectoralis major muscle, the breast tissue was swung laterally and a lateral pedicle identified where breast tissue gave way to fat of axilla. The lateral pedicle was incised and the specimen removed.   Specimen marked long lateral, short superior and sent to pathology. The wound was irrigated and hemostasis was achieved. Exparel infused as pec block and procedure passed to Dr. Ulice Bold for her portion of the procedure.   .  The patient tolerated the procedure well during above portion.

## 2023-01-02 ENCOUNTER — Telehealth: Payer: Self-pay | Admitting: Plastic Surgery

## 2023-01-02 ENCOUNTER — Ambulatory Visit (INDEPENDENT_AMBULATORY_CARE_PROVIDER_SITE_OTHER): Payer: Self-pay | Admitting: Surgical

## 2023-01-02 ENCOUNTER — Encounter: Payer: Self-pay | Admitting: Surgery

## 2023-01-02 DIAGNOSIS — E119 Type 2 diabetes mellitus without complications: Secondary | ICD-10-CM | POA: Diagnosis not present

## 2023-01-02 DIAGNOSIS — C50311 Malignant neoplasm of lower-inner quadrant of right female breast: Secondary | ICD-10-CM | POA: Diagnosis not present

## 2023-01-02 DIAGNOSIS — Z87891 Personal history of nicotine dependence: Secondary | ICD-10-CM | POA: Diagnosis not present

## 2023-01-02 DIAGNOSIS — Z79899 Other long term (current) drug therapy: Secondary | ICD-10-CM | POA: Diagnosis not present

## 2023-01-02 DIAGNOSIS — E039 Hypothyroidism, unspecified: Secondary | ICD-10-CM | POA: Diagnosis not present

## 2023-01-02 DIAGNOSIS — I10 Essential (primary) hypertension: Secondary | ICD-10-CM | POA: Diagnosis not present

## 2023-01-02 DIAGNOSIS — C50911 Malignant neoplasm of unspecified site of right female breast: Secondary | ICD-10-CM

## 2023-01-02 LAB — BASIC METABOLIC PANEL
Anion gap: 10 (ref 5–15)
BUN: 15 mg/dL (ref 6–20)
CO2: 23 mmol/L (ref 22–32)
Calcium: 9.3 mg/dL (ref 8.9–10.3)
Chloride: 108 mmol/L (ref 98–111)
Creatinine, Ser: 0.87 mg/dL (ref 0.44–1.00)
GFR, Estimated: >60 mL/min (ref >60)
Glucose, Bld: 284 mg/dL — ABNORMAL HIGH (ref 70–99)
Potassium: 4.2 mmol/L (ref 3.5–5.1)
Sodium: 141 mmol/L (ref 135–145)

## 2023-01-02 LAB — CBC
HCT: 37.6 % (ref 36.0–46.0)
Hemoglobin: 11.6 g/dL — ABNORMAL LOW (ref 12.0–15.0)
MCH: 25.1 pg — ABNORMAL LOW (ref 26.0–34.0)
MCHC: 30.9 g/dL (ref 30.0–36.0)
MCV: 81.4 fL (ref 80.0–100.0)
Platelets: 338 10*3/uL (ref 150–400)
RBC: 4.62 MIL/uL (ref 3.87–5.11)
RDW: 14.1 % (ref 11.5–15.5)
WBC: 10.6 10*3/uL — ABNORMAL HIGH (ref 4.0–10.5)
nRBC: 0 % (ref 0.0–0.2)

## 2023-01-02 LAB — GLUCOSE, CAPILLARY: Glucose-Capillary: 226 mg/dL — ABNORMAL HIGH (ref 70–99)

## 2023-01-02 MED ORDER — IBUPROFEN 800 MG PO TABS: 800.0000 mg | ORAL_TABLET | Freq: Three times a day (TID) | ORAL | 0 refills | Status: AC | PRN

## 2023-01-02 NOTE — TOC CM/SW Note (Signed)
Patient has orders to discharge home today. Chart reviewed. No TOC needs identified. RN confirmed she has been weaned to room air. CSW signing off.  Charlynn Court, CSW [phone]

## 2023-01-02 NOTE — Progress Notes (Signed)
Patient is a 56 year old female postop day 1 from right mastectomy with Dr. Tonna Boehringer followed by immediate right breast reconstruction placement tissue expander with Dr. Ulice Bold.  She did stay overnight for observation.  Called patient this a.m., she reports she is doing well.  She reports that she was feeling nauseous yesterday after surgery, but is feeling much better now.  She reports she is urinating normally.  She is not having any infectious symptoms.  She reports that she has had positive flatus but no BM yet.  She does report that she is eating breakfast this a.m.  She does not have any specific questions, feels as if she is doing well and will be discharged today per general surgery.  Discussed with patient to please call with any questions or concerns, she will follow-up at her first postop appointment next week.  The patient gave consent to have this visit done by telemedicine / virtual visit, two identifiers were used to identify patient. This is also consent for access the chart and treat the patient via this visit. The patient is located in [place].  I, the provider, am at the office.  We spent 3 minutes together for the visit.  Joined by telephone.

## 2023-01-02 NOTE — Anesthesia Postprocedure Evaluation (Signed)
Anesthesia Post Note  Patient: Tina Payne  Procedure(s) Performed: MASTECTOMY WITH SENTINEL LYMPH NODE BIOPSY (Right: Breast) BREAST RECONSTRUCTION WITH PLACEMENT OF TISSUE EXPANDER AND FLEX HD (ACELLULAR HYDRATED DERMIS) (Right: Breast)  Patient location during evaluation: PACU Anesthesia Type: General Level of consciousness: awake and alert Pain management: pain level controlled Vital Signs Assessment: post-procedure vital signs reviewed and stable Respiratory status: spontaneous breathing, nonlabored ventilation, respiratory function stable and patient connected to nasal cannula oxygen Cardiovascular status: blood pressure returned to baseline and stable Postop Assessment: no apparent nausea or vomiting Anesthetic complications: no   No notable events documented.   Last Vitals:  Vitals:   01/02/23 0844 01/02/23 0912  BP:  119/71  Pulse:  70  Resp: 18 16  Temp:  36.5 C  SpO2:  97%    Last Pain:  Vitals:   01/02/23 0844  TempSrc:   PainSc: 1                  Stephanie Coup

## 2023-01-02 NOTE — Telephone Encounter (Signed)
Patient called and said she had surgery with Dr Ulice Bold yesterday and she wanted to know what are ranges of motion that she can lift her arm. Please call back at [phone]

## 2023-01-02 NOTE — Discharge Summary (Signed)
Physician Discharge Summary  Patient ID: Tina Payne MRN: [id number] DOB/AGE: 58-03-1966 58 y.o.  Admit date: 01/01/2023 Discharge date: 01/02/2023  Admission Diagnoses: Breast cancer  Discharge Diagnoses:  Same as above  Discharged Condition: good  Hospital Course: admitted for above. Underwent surgery.  Please see op notes for details.  Post op, recovered as expected.  At time of d/c, tolerating diet and pain controlled  Consults: None  Discharge Exam: Blood pressure 119/71, pulse 70, temperature 97.7 F (36.5 C), resp. rate 16, height 5\' 8"  (1.727 m), weight 88.9 kg, SpO2 97 %. General appearance: alert, cooperative, and no distress Chest wall: no tenderness, old sanguinous drainage from JP, no significant swelling or bruising noted in visible chest wall around binder.  Disposition:  Discharge disposition: 01-Home or Self Care        Allergies as of 01/02/2023   No Known Allergies      Medication List     STOP taking these medications    Trulicity 4.5 MG/0.5ML Sopn Generic drug: Dulaglutide       TAKE these medications    ALPRAZolam 0.5 MG tablet Commonly known as: XANAX BID as needed for anxiety/stress   atorvastatin 20 MG tablet Commonly known as: LIPITOR Take 20 mg by mouth daily.   calcium carbonate 750 MG chewable tablet Commonly known as: TUMS EX Chew 2 tablets by mouth as needed for heartburn.   cephALEXin 500 MG capsule Commonly known as: Keflex Take 1 capsule (500 mg total) by mouth 4 (four) times daily.   Cholecalciferol 50 MCG (2000 UT) Tabs Take 5,000 Units by mouth.   ibuprofen 200 MG tablet Commonly known as: ADVIL Take 200 mg by mouth every 6 (six) hours as needed. What changed: Another medication with the same name was added. Make sure you understand how and when to take each.   ibuprofen 800 MG tablet Commonly known as: ADVIL Take 1 tablet (800 mg total) by mouth every 8 (eight) hours as needed. What changed: You  were already taking a medication with the same name, and this prescription was added. Make sure you understand how and when to take each.   levothyroxine 75 MCG tablet Commonly known as: SYNTHROID Take by mouth.   metFORMIN 500 MG 24 hr tablet Commonly known as: GLUCOPHAGE-XR Take by mouth daily with breakfast.   ondansetron 4 MG tablet Commonly known as: Zofran Take 1 tablet (4 mg total) by mouth every 8 (eight) hours as needed for nausea or vomiting.   oxyCODONE-acetaminophen 5-325 MG tablet Commonly known as: PERCOCET/ROXICET Take 1 tablet by mouth every 6 (six) hours as needed for severe pain.        Follow-up Information     Dillingham, Alena Bills, DO. Go in 10 day(s).   Specialty: Plastic Surgery Why: 01/07/23 8:30 AM Contact information: [address] [place] [address] [phone]         Sung Amabile, DO Follow up in 2 week(s).   Specialty: Surgery Why: 01/07/23 2:30 PM  post op mastectomy Contact information: [address] [place] [address] [phone]                  Total time spent arranging discharge was >58min. Signed: Sung Amabile 01/02/2023, 11:09 AM

## 2023-01-05 NOTE — Telephone Encounter (Signed)
Spoke to patient and informed she could perform personal grooming, but not lifting overhead or reaching overhead. Pt conveyed understanding. I also informed pt that she needs to keep her binder on and she could look for a sports bra that zips in the front. She will do that. Pt has an appointment on 5/22-any other questions will be answered at that time.

## 2023-01-05 NOTE — Telephone Encounter (Signed)
Pt is calling again wanting to know her restrictions on R arm for lifting over head and asking when she can take the bra binding off

## 2023-01-06 ENCOUNTER — Encounter: Payer: Medicaid Other | Admitting: Plastic Surgery

## 2023-01-07 ENCOUNTER — Other Ambulatory Visit: Payer: Self-pay | Admitting: Pathology

## 2023-01-07 ENCOUNTER — Encounter: Payer: Medicaid Other | Admitting: Physician Assistant

## 2023-01-07 ENCOUNTER — Ambulatory Visit (INDEPENDENT_AMBULATORY_CARE_PROVIDER_SITE_OTHER): Payer: 59 | Admitting: Physician Assistant

## 2023-01-07 DIAGNOSIS — C50911 Malignant neoplasm of unspecified site of right female breast: Secondary | ICD-10-CM

## 2023-01-07 LAB — SURGICAL PATHOLOGY

## 2023-01-07 NOTE — Progress Notes (Signed)
This is a 57 year old female who is postop right-sided mastectomy with Dr. Tonna Boehringer followed by immediate right breast reconstruction and placement of tissue expander with Dr. Ulice Bold on 01/01/2023.  She had 200 cc placed in her expander at that time.  Postoperatively the patient notes that she has been doing well.  She denies any significant pain.  She notes some irritation at the right drain site but otherwise is happy with her progress.  She denies any fever or infectious signs or symptoms.  She notes light drainage from the JP drain varying from the high teens to low 30s.   Chaperone present.  On exam the dressings are in place, they were removed revealing Steri-Strips, no overlying redness, mastectomy flaps are viable, no warmth to touch, no palpable fluid collections.  JP drain with minimal serosanguineous output.   Injectable saline was placed into right tissue expander    Right: 50 cc for a total of 250/650 cc   The patient tolerated this well without any issues   Overall the patient is doing well today with no issues or concerns.  I discussed proceeding with a fill today, she was in agreement, I placed 50 cc in.  She still has a ways to go.  Her drain is doing well with expected output.  I like to see her back in the office for her regularly scheduled visit.  She was given strict return precautions.  She verbalized understanding and agreement to today's plan had no further questions or concerns.

## 2023-01-08 ENCOUNTER — Telehealth: Payer: Self-pay | Admitting: *Deleted

## 2023-01-08 ENCOUNTER — Encounter: Payer: Self-pay | Admitting: *Deleted

## 2023-01-08 ENCOUNTER — Encounter: Payer: Self-pay | Admitting: Plastic Surgery

## 2023-01-08 DIAGNOSIS — F419 Anxiety disorder, unspecified: Secondary | ICD-10-CM | POA: Diagnosis not present

## 2023-01-08 DIAGNOSIS — Z0000 Encounter for general adult medical examination without abnormal findings: Secondary | ICD-10-CM | POA: Diagnosis not present

## 2023-01-08 DIAGNOSIS — E118 Type 2 diabetes mellitus with unspecified complications: Secondary | ICD-10-CM | POA: Diagnosis not present

## 2023-01-08 DIAGNOSIS — Z9010 Acquired absence of unspecified breast and nipple: Secondary | ICD-10-CM | POA: Diagnosis not present

## 2023-01-08 NOTE — Telephone Encounter (Signed)
Enter in error.//AB/CMA 

## 2023-01-08 NOTE — Progress Notes (Signed)
Oncotype Dx order submitted online on path [phone].  Appt. With Dr. Cathie Hoops moved to 6/7, appt. Details given to [name].   Explained to her that her pathology came back with some invasive cancer and the reason the oncotype testing is being sent.   No further questions at this time.

## 2023-01-08 NOTE — Telephone Encounter (Signed)
Faxed order,demographics,insurance information,and recent office notes to [facility] for Mastectomy supplies for the patient.  Confirmation received and copy scanned into the chart.//AB/CMA

## 2023-01-14 DIAGNOSIS — C50911 Malignant neoplasm of unspecified site of right female breast: Secondary | ICD-10-CM | POA: Diagnosis not present

## 2023-01-16 ENCOUNTER — Inpatient Hospital Stay: Payer: Medicaid Other | Admitting: Oncology

## 2023-01-16 ENCOUNTER — Telehealth: Payer: Self-pay | Admitting: *Deleted

## 2023-01-16 ENCOUNTER — Encounter: Payer: Self-pay | Admitting: Plastic Surgery

## 2023-01-16 ENCOUNTER — Telehealth: Payer: Self-pay | Admitting: Physician Assistant

## 2023-01-16 NOTE — Telephone Encounter (Signed)
I spoke with Ms. Barca today.  She notes that she has been feeling well up until today.  She notes minimal drain output which has been serous with a brown tinge to it.  She notes today she just feels overly tired and sleepy, she denies any respiratory symptoms, she denies any fever.  She notes she had 1 episode of emesis and felt slightly better after that.  She notes she has taken Zofran, and is tolerating p.o. denies any signs of infection, she notes some continued swelling of the breast.  She notes that her drain output did increase today to 60 cc of brown-tinged serous material but has not continued with high volume output.  I discussed with her that it does not sound like she has any signs of infection relating to her breast, her drain output can fluctuate and is not at a level that is concerning, there is no signs of bleeding.  I would like to keep in touch with her, I instructed her to reach out to our on-call team if she develops any fever, redness, swelling, or any signs of infection or concerning signs or symptoms.  She should seek care in the emergency room if develops any concerning signs or symptoms unrelated to the breast including extreme weakness or fatigue.  The patient understands that we are available at any time, if she continues to have any concerns through the weekend I am happy to see her Monday as well.  The patient is happy with today's plan.  She will continue drinking plenty fluids and rest and monitor for any changes and notify us if any present.  She had no further questions or concerns.

## 2023-01-16 NOTE — Telephone Encounter (Signed)
Received on (01/14/2023) via of fax DME Standard Written Order Georgana Curio) from [facility].  Requesting signature and return.  Given to provider to sign.    DME Standard Written Order Georgana Curio) signed and faxed back to [facility].  Confirmation received and copy scanned into the chart.//AB/CMA

## 2023-01-17 ENCOUNTER — Inpatient Hospital Stay
Admission: EM | Admit: 2023-01-17 | Discharge: 2023-01-26 | DRG: 856 | Disposition: A | Discharge status: Home / Self Care | Payer: 59 | Attending: Surgery | Admitting: Surgery

## 2023-01-17 DIAGNOSIS — N6489 Other specified disorders of breast: Secondary | ICD-10-CM

## 2023-01-17 DIAGNOSIS — Z87891 Personal history of nicotine dependence: Secondary | ICD-10-CM

## 2023-01-17 DIAGNOSIS — Y834 Other reconstructive surgery as the cause of abnormal reaction of the patient, or of later complication, without mention of misadventure at the time of the procedure: Secondary | ICD-10-CM | POA: Diagnosis present

## 2023-01-17 DIAGNOSIS — T8141XA Infection following a procedure, superficial incisional surgical site, initial encounter: Principal | ICD-10-CM | POA: Diagnosis present

## 2023-01-17 DIAGNOSIS — Z853 Personal history of malignant neoplasm of breast: Secondary | ICD-10-CM

## 2023-01-17 DIAGNOSIS — G4489 Other headache syndrome: Secondary | ICD-10-CM | POA: Diagnosis not present

## 2023-01-17 DIAGNOSIS — I700 Atherosclerosis of aorta: Secondary | ICD-10-CM | POA: Diagnosis present

## 2023-01-17 DIAGNOSIS — Z79899 Other long term (current) drug therapy: Secondary | ICD-10-CM

## 2023-01-17 DIAGNOSIS — R0689 Other abnormalities of breathing: Secondary | ICD-10-CM | POA: Diagnosis not present

## 2023-01-17 DIAGNOSIS — Z743 Need for continuous supervision: Secondary | ICD-10-CM | POA: Diagnosis not present

## 2023-01-17 DIAGNOSIS — E538 Deficiency of other specified B group vitamins: Secondary | ICD-10-CM | POA: Diagnosis present

## 2023-01-17 DIAGNOSIS — B9561 Methicillin susceptible Staphylococcus aureus infection as the cause of diseases classified elsewhere: Secondary | ICD-10-CM | POA: Diagnosis present

## 2023-01-17 DIAGNOSIS — R739 Hyperglycemia, unspecified: Secondary | ICD-10-CM | POA: Diagnosis not present

## 2023-01-17 DIAGNOSIS — I10 Essential (primary) hypertension: Secondary | ICD-10-CM | POA: Diagnosis not present

## 2023-01-17 DIAGNOSIS — A419 Sepsis, unspecified organism: Secondary | ICD-10-CM

## 2023-01-17 DIAGNOSIS — E063 Autoimmune thyroiditis: Secondary | ICD-10-CM | POA: Diagnosis present

## 2023-01-17 DIAGNOSIS — Z833 Family history of diabetes mellitus: Secondary | ICD-10-CM

## 2023-01-17 DIAGNOSIS — E119 Type 2 diabetes mellitus without complications: Secondary | ICD-10-CM

## 2023-01-17 DIAGNOSIS — E1165 Type 2 diabetes mellitus with hyperglycemia: Secondary | ICD-10-CM | POA: Insufficient documentation

## 2023-01-17 DIAGNOSIS — Z7984 Long term (current) use of oral hypoglycemic drugs: Secondary | ICD-10-CM

## 2023-01-17 DIAGNOSIS — E663 Overweight: Secondary | ICD-10-CM | POA: Insufficient documentation

## 2023-01-17 DIAGNOSIS — F419 Anxiety disorder, unspecified: Secondary | ICD-10-CM | POA: Diagnosis present

## 2023-01-17 DIAGNOSIS — E039 Hypothyroidism, unspecified: Secondary | ICD-10-CM | POA: Diagnosis present

## 2023-01-17 DIAGNOSIS — C50919 Malignant neoplasm of unspecified site of unspecified female breast: Secondary | ICD-10-CM | POA: Diagnosis present

## 2023-01-17 DIAGNOSIS — Z6828 Body mass index (BMI) 28.0-28.9, adult: Secondary | ICD-10-CM

## 2023-01-17 DIAGNOSIS — Z79890 Hormone replacement therapy (postmenopausal): Secondary | ICD-10-CM

## 2023-01-17 DIAGNOSIS — T8140XA Infection following a procedure, unspecified, initial encounter: Secondary | ICD-10-CM | POA: Diagnosis not present

## 2023-01-17 DIAGNOSIS — J439 Emphysema, unspecified: Secondary | ICD-10-CM | POA: Diagnosis present

## 2023-01-17 DIAGNOSIS — L02213 Cutaneous abscess of chest wall: Secondary | ICD-10-CM | POA: Diagnosis present

## 2023-01-17 DIAGNOSIS — E876 Hypokalemia: Secondary | ICD-10-CM | POA: Insufficient documentation

## 2023-01-17 DIAGNOSIS — Z801 Family history of malignant neoplasm of trachea, bronchus and lung: Secondary | ICD-10-CM

## 2023-01-17 DIAGNOSIS — K5900 Constipation, unspecified: Secondary | ICD-10-CM | POA: Diagnosis not present

## 2023-01-17 DIAGNOSIS — R000 Tachycardia, unspecified: Secondary | ICD-10-CM | POA: Diagnosis not present

## 2023-01-17 DIAGNOSIS — D638 Anemia in other chronic diseases classified elsewhere: Secondary | ICD-10-CM | POA: Diagnosis present

## 2023-01-17 DIAGNOSIS — M96843 Postprocedural seroma of a musculoskeletal structure following other procedure: Secondary | ICD-10-CM | POA: Diagnosis present

## 2023-01-17 DIAGNOSIS — E7800 Pure hypercholesterolemia, unspecified: Secondary | ICD-10-CM | POA: Diagnosis present

## 2023-01-17 DIAGNOSIS — Z9011 Acquired absence of right breast and nipple: Secondary | ICD-10-CM

## 2023-01-18 ENCOUNTER — Encounter: Payer: Self-pay | Admitting: Surgery

## 2023-01-18 ENCOUNTER — Other Ambulatory Visit: Payer: Self-pay

## 2023-01-18 ENCOUNTER — Emergency Department: Payer: 59

## 2023-01-18 DIAGNOSIS — M96843 Postprocedural seroma of a musculoskeletal structure following other procedure: Secondary | ICD-10-CM | POA: Diagnosis present

## 2023-01-18 DIAGNOSIS — Z801 Family history of malignant neoplasm of trachea, bronchus and lung: Secondary | ICD-10-CM | POA: Diagnosis not present

## 2023-01-18 DIAGNOSIS — E119 Type 2 diabetes mellitus without complications: Secondary | ICD-10-CM | POA: Diagnosis not present

## 2023-01-18 DIAGNOSIS — Y834 Other reconstructive surgery as the cause of abnormal reaction of the patient, or of later complication, without mention of misadventure at the time of the procedure: Secondary | ICD-10-CM | POA: Diagnosis not present

## 2023-01-18 DIAGNOSIS — Z853 Personal history of malignant neoplasm of breast: Secondary | ICD-10-CM | POA: Diagnosis not present

## 2023-01-18 DIAGNOSIS — E1165 Type 2 diabetes mellitus with hyperglycemia: Secondary | ICD-10-CM | POA: Diagnosis not present

## 2023-01-18 DIAGNOSIS — Z9011 Acquired absence of right breast and nipple: Secondary | ICD-10-CM | POA: Diagnosis not present

## 2023-01-18 DIAGNOSIS — Z79899 Other long term (current) drug therapy: Secondary | ICD-10-CM | POA: Diagnosis not present

## 2023-01-18 DIAGNOSIS — Z79890 Hormone replacement therapy (postmenopausal): Secondary | ICD-10-CM | POA: Diagnosis not present

## 2023-01-18 DIAGNOSIS — B9561 Methicillin susceptible Staphylococcus aureus infection as the cause of diseases classified elsewhere: Secondary | ICD-10-CM | POA: Diagnosis not present

## 2023-01-18 DIAGNOSIS — T8579XA Infection and inflammatory reaction due to other internal prosthetic devices, implants and grafts, initial encounter: Secondary | ICD-10-CM | POA: Diagnosis not present

## 2023-01-18 DIAGNOSIS — E876 Hypokalemia: Secondary | ICD-10-CM | POA: Diagnosis not present

## 2023-01-18 DIAGNOSIS — E663 Overweight: Secondary | ICD-10-CM | POA: Diagnosis not present

## 2023-01-18 DIAGNOSIS — Z6828 Body mass index (BMI) 28.0-28.9, adult: Secondary | ICD-10-CM | POA: Diagnosis not present

## 2023-01-18 DIAGNOSIS — L7634 Postprocedural seroma of skin and subcutaneous tissue following other procedure: Secondary | ICD-10-CM | POA: Diagnosis not present

## 2023-01-18 DIAGNOSIS — C50311 Malignant neoplasm of lower-inner quadrant of right female breast: Secondary | ICD-10-CM | POA: Diagnosis not present

## 2023-01-18 DIAGNOSIS — F419 Anxiety disorder, unspecified: Secondary | ICD-10-CM | POA: Diagnosis present

## 2023-01-18 DIAGNOSIS — E039 Hypothyroidism, unspecified: Secondary | ICD-10-CM | POA: Diagnosis not present

## 2023-01-18 DIAGNOSIS — J9811 Atelectasis: Secondary | ICD-10-CM | POA: Diagnosis not present

## 2023-01-18 DIAGNOSIS — T8141XA Infection following a procedure, superficial incisional surgical site, initial encounter: Secondary | ICD-10-CM | POA: Diagnosis not present

## 2023-01-18 DIAGNOSIS — Z7984 Long term (current) use of oral hypoglycemic drugs: Secondary | ICD-10-CM | POA: Diagnosis not present

## 2023-01-18 DIAGNOSIS — N61 Mastitis without abscess: Secondary | ICD-10-CM | POA: Diagnosis not present

## 2023-01-18 DIAGNOSIS — A4901 Methicillin susceptible Staphylococcus aureus infection, unspecified site: Secondary | ICD-10-CM | POA: Diagnosis not present

## 2023-01-18 DIAGNOSIS — Z87891 Personal history of nicotine dependence: Secondary | ICD-10-CM | POA: Diagnosis not present

## 2023-01-18 DIAGNOSIS — J439 Emphysema, unspecified: Secondary | ICD-10-CM | POA: Diagnosis not present

## 2023-01-18 DIAGNOSIS — Z1211 Encounter for screening for malignant neoplasm of colon: Secondary | ICD-10-CM | POA: Diagnosis not present

## 2023-01-18 DIAGNOSIS — Z170 Estrogen receptor positive status [ER+]: Secondary | ICD-10-CM | POA: Diagnosis not present

## 2023-01-18 DIAGNOSIS — T8144XA Sepsis following a procedure, initial encounter: Secondary | ICD-10-CM | POA: Diagnosis not present

## 2023-01-18 DIAGNOSIS — L02213 Cutaneous abscess of chest wall: Secondary | ICD-10-CM | POA: Diagnosis not present

## 2023-01-18 DIAGNOSIS — E538 Deficiency of other specified B group vitamins: Secondary | ICD-10-CM | POA: Diagnosis present

## 2023-01-18 DIAGNOSIS — K5900 Constipation, unspecified: Secondary | ICD-10-CM | POA: Diagnosis not present

## 2023-01-18 DIAGNOSIS — T8140XA Infection following a procedure, unspecified, initial encounter: Secondary | ICD-10-CM | POA: Diagnosis not present

## 2023-01-18 DIAGNOSIS — C50911 Malignant neoplasm of unspecified site of right female breast: Secondary | ICD-10-CM | POA: Diagnosis not present

## 2023-01-18 DIAGNOSIS — E7800 Pure hypercholesterolemia, unspecified: Secondary | ICD-10-CM | POA: Diagnosis present

## 2023-01-18 DIAGNOSIS — N6489 Other specified disorders of breast: Secondary | ICD-10-CM | POA: Diagnosis not present

## 2023-01-18 DIAGNOSIS — D638 Anemia in other chronic diseases classified elsewhere: Secondary | ICD-10-CM | POA: Diagnosis not present

## 2023-01-18 DIAGNOSIS — E063 Autoimmune thyroiditis: Secondary | ICD-10-CM | POA: Diagnosis not present

## 2023-01-18 DIAGNOSIS — I700 Atherosclerosis of aorta: Secondary | ICD-10-CM | POA: Diagnosis not present

## 2023-01-18 DIAGNOSIS — A419 Sepsis, unspecified organism: Secondary | ICD-10-CM

## 2023-01-18 LAB — URINALYSIS, COMPLETE (UACMP) WITH MICROSCOPIC
Bacteria, UA: NONE SEEN
Bilirubin Urine: NEGATIVE
Glucose, UA: >=500 mg/dL — AB
Hgb urine dipstick: NEGATIVE
Ketones, ur: 5 mg/dL — AB
Leukocytes,Ua: NEGATIVE
Nitrite: NEGATIVE
Protein, ur: 30 mg/dL — AB
Specific Gravity, Urine: 1.033 — ABNORMAL HIGH (ref 1.005–1.030)
pH: 5 (ref 5.0–8.0)

## 2023-01-18 LAB — COMPREHENSIVE METABOLIC PANEL
ALT: 39 U/L (ref 0–44)
AST: 25 U/L (ref 15–41)
Albumin: 3.9 g/dL (ref 3.5–5.0)
Alkaline Phosphatase: 135 U/L — ABNORMAL HIGH (ref 38–126)
Anion gap: 8 (ref 5–15)
BUN: 13 mg/dL (ref 6–20)
CO2: 26 mmol/L (ref 22–32)
Calcium: 9.2 mg/dL (ref 8.9–10.3)
Chloride: 98 mmol/L (ref 98–111)
Creatinine, Ser: 0.93 mg/dL (ref 0.44–1.00)
GFR, Estimated: >60 mL/min (ref >60)
Glucose, Bld: 344 mg/dL — ABNORMAL HIGH (ref 70–99)
Potassium: 3.7 mmol/L (ref 3.5–5.1)
Sodium: 132 mmol/L — ABNORMAL LOW (ref 135–145)
Total Bilirubin: 0.7 mg/dL (ref 0.3–1.2)
Total Protein: 7.3 g/dL (ref 6.5–8.1)

## 2023-01-18 LAB — CBC WITH DIFFERENTIAL/PLATELET
Abs Immature Granulocytes: 0.16 10*3/uL — ABNORMAL HIGH (ref 0.00–0.07)
Basophils Absolute: 0.1 10*3/uL (ref 0.0–0.1)
Basophils Relative: 0 %
Eosinophils Absolute: 0.1 10*3/uL (ref 0.0–0.5)
Eosinophils Relative: 0 %
HCT: 36.4 % (ref 36.0–46.0)
Hemoglobin: 11.4 g/dL — ABNORMAL LOW (ref 12.0–15.0)
Immature Granulocytes: 1 %
Lymphocytes Relative: 6 %
Lymphs Abs: 1.2 10*3/uL (ref 0.7–4.0)
MCH: 24.7 pg — ABNORMAL LOW (ref 26.0–34.0)
MCHC: 31.3 g/dL (ref 30.0–36.0)
MCV: 79 fL — ABNORMAL LOW (ref 80.0–100.0)
Monocytes Absolute: 1.7 10*3/uL — ABNORMAL HIGH (ref 0.1–1.0)
Monocytes Relative: 9 %
Neutro Abs: 16.2 10*3/uL — ABNORMAL HIGH (ref 1.7–7.7)
Neutrophils Relative %: 84 %
Platelets: 304 10*3/uL (ref 150–400)
RBC: 4.61 MIL/uL (ref 3.87–5.11)
RDW: 13.7 % (ref 11.5–15.5)
WBC: 19.4 10*3/uL — ABNORMAL HIGH (ref 4.0–10.5)
nRBC: 0 % (ref 0.0–0.2)

## 2023-01-18 LAB — CBG MONITORING, ED
Glucose-Capillary: 299 mg/dL — ABNORMAL HIGH (ref 70–99)
Glucose-Capillary: 243 mg/dL — ABNORMAL HIGH (ref 70–99)
Glucose-Capillary: 266 mg/dL — ABNORMAL HIGH (ref 70–99)

## 2023-01-18 LAB — GLUCOSE, CAPILLARY: Glucose-Capillary: 170 mg/dL — ABNORMAL HIGH (ref 70–99)

## 2023-01-18 LAB — LACTIC ACID, PLASMA: Lactic Acid, Venous: 1.8 mmol/L (ref 0.5–1.9)

## 2023-01-18 MED ORDER — ONDANSETRON HCL 4 MG/2ML IJ SOLN
4.0000 mg | Freq: Four times a day (QID) | INTRAMUSCULAR | Status: DC | PRN
  Administered 2023-01-18 (×2): 4 mg via INTRAVENOUS
  Filled 2023-01-18 (×3): qty 2

## 2023-01-18 MED ORDER — ACETAMINOPHEN 500 MG PO TABS
1000.0000 mg | ORAL_TABLET | Freq: Once | ORAL | Status: AC
  Administered 2023-01-18: 1000 mg via ORAL
  Filled 2023-01-18: qty 2

## 2023-01-18 MED ORDER — SODIUM CHLORIDE 0.9 % IV SOLN
1.0000 g | Freq: Two times a day (BID) | INTRAVENOUS | Status: DC
  Administered 2023-01-18: 1 g via INTRAVENOUS
  Filled 2023-01-18: qty 10

## 2023-01-18 MED ORDER — SODIUM CHLORIDE 0.9 % IV SOLN: 6.2500 mg | Freq: Four times a day (QID) | INTRAVENOUS | Status: AC | PRN

## 2023-01-18 MED ORDER — SODIUM CHLORIDE 0.9 % IV SOLN
2.0000 g | Freq: Three times a day (TID) | INTRAVENOUS | Status: DC
  Administered 2023-01-18 – 2023-01-20 (×6): 2 g via INTRAVENOUS
  Filled 2023-01-18 (×7): qty 12.5

## 2023-01-18 MED ORDER — LACTATED RINGERS IV BOLUS (SEPSIS)
1000.0000 mL | Freq: Once | INTRAVENOUS | Status: AC
  Administered 2023-01-18: 1000 mL via INTRAVENOUS

## 2023-01-18 MED ORDER — VANCOMYCIN HCL 2000 MG/400ML IV SOLN
2000.0000 mg | Freq: Once | INTRAVENOUS | Status: AC
  Administered 2023-01-18: 2000 mg via INTRAVENOUS
  Filled 2023-01-18: qty 400

## 2023-01-18 MED ORDER — IOHEXOL 300 MG/ML  SOLN
75.0000 mL | Freq: Once | INTRAMUSCULAR | Status: AC | PRN
  Administered 2023-01-18: 75 mL via INTRAVENOUS

## 2023-01-18 MED ORDER — ALPRAZOLAM 0.5 MG PO TABS: 0.5000 mg | ORAL_TABLET | Freq: Two times a day (BID) | ORAL | Status: DC | PRN

## 2023-01-18 MED ORDER — DOCUSATE SODIUM 100 MG PO CAPS
100.0000 mg | ORAL_CAPSULE | Freq: Two times a day (BID) | ORAL | Status: AC
  Administered 2023-01-18 – 2023-01-19 (×2): 100 mg via ORAL
  Filled 2023-01-18 (×2): qty 1

## 2023-01-18 MED ORDER — ONDANSETRON HCL 4 MG/2ML IJ SOLN: 4.0000 mg | Freq: Four times a day (QID) | INTRAMUSCULAR | Status: DC | PRN

## 2023-01-18 MED ORDER — VANCOMYCIN HCL IN DEXTROSE 1-5 GM/200ML-% IV SOLN: 1000.0000 mg | Freq: Once | INTRAVENOUS | Status: DC

## 2023-01-18 MED ORDER — METRONIDAZOLE 500 MG/100ML IV SOLN
500.0000 mg | Freq: Two times a day (BID) | INTRAVENOUS | Status: DC
  Administered 2023-01-18 – 2023-01-20 (×5): 500 mg via INTRAVENOUS
  Filled 2023-01-18 (×5): qty 100

## 2023-01-18 MED ORDER — DIPHENHYDRAMINE HCL 25 MG PO CAPS: 25.0000 mg | ORAL_CAPSULE | Freq: Four times a day (QID) | ORAL | Status: DC | PRN

## 2023-01-18 MED ORDER — INSULIN ASPART 100 UNIT/ML IJ SOLN
0.0000 [IU] | Freq: Three times a day (TID) | INTRAMUSCULAR | Status: DC
  Administered 2023-01-18: 8 [IU] via SUBCUTANEOUS
  Administered 2023-01-18: 5 [IU] via SUBCUTANEOUS
  Filled 2023-01-18 (×2): qty 1

## 2023-01-18 MED ORDER — MORPHINE SULFATE (PF) 4 MG/ML IV SOLN: 4.0000 mg | INTRAVENOUS | Status: AC | PRN

## 2023-01-18 MED ORDER — ATORVASTATIN CALCIUM 20 MG PO TABS
20.0000 mg | ORAL_TABLET | Freq: Every day | ORAL | Status: DC
  Administered 2023-01-18 – 2023-01-25 (×8): 20 mg via ORAL
  Filled 2023-01-18 (×8): qty 1

## 2023-01-18 MED ORDER — INSULIN ASPART 100 UNIT/ML IJ SOLN
0.0000 [IU] | Freq: Three times a day (TID) | INTRAMUSCULAR | Status: DC
  Administered 2023-01-18: 11 [IU] via SUBCUTANEOUS
  Filled 2023-01-18: qty 1

## 2023-01-18 MED ORDER — ONDANSETRON 4 MG PO TBDP
4.0000 mg | ORAL_TABLET | Freq: Four times a day (QID) | ORAL | Status: DC | PRN
  Administered 2023-01-18: 4 mg via ORAL
  Filled 2023-01-18: qty 1

## 2023-01-18 MED ORDER — HYDRALAZINE HCL 20 MG/ML IJ SOLN: 10.0000 mg | INTRAMUSCULAR | Status: DC | PRN

## 2023-01-18 MED ORDER — VANCOMYCIN HCL IN DEXTROSE 1-5 GM/200ML-% IV SOLN
1000.0000 mg | Freq: Two times a day (BID) | INTRAVENOUS | Status: DC
  Administered 2023-01-18 – 2023-01-20 (×4): 1000 mg via INTRAVENOUS
  Filled 2023-01-18 (×5): qty 200

## 2023-01-18 MED ORDER — ACETAMINOPHEN 500 MG PO TABS
1000.0000 mg | ORAL_TABLET | Freq: Four times a day (QID) | ORAL | Status: AC | PRN
  Administered 2023-01-18 – 2023-01-23 (×5): 1000 mg via ORAL
  Filled 2023-01-18 (×5): qty 2

## 2023-01-18 MED ORDER — SODIUM CHLORIDE 0.9 % IV SOLN
2.0000 g | Freq: Once | INTRAVENOUS | Status: AC
  Administered 2023-01-18: 2 g via INTRAVENOUS
  Filled 2023-01-18: qty 12.5

## 2023-01-18 MED ORDER — METRONIDAZOLE 500 MG/100ML IV SOLN
500.0000 mg | Freq: Once | INTRAVENOUS | Status: AC
  Administered 2023-01-18: 500 mg via INTRAVENOUS
  Filled 2023-01-18: qty 100

## 2023-01-18 MED ORDER — ACETAMINOPHEN 650 MG RE SUPP: 650.0000 mg | Freq: Four times a day (QID) | RECTAL | Status: AC | PRN

## 2023-01-18 MED ORDER — ONDANSETRON HCL 4 MG/2ML IJ SOLN
4.0000 mg | Freq: Once | INTRAMUSCULAR | Status: AC
  Administered 2023-01-18: 4 mg via INTRAVENOUS
  Filled 2023-01-18: qty 2

## 2023-01-18 MED ORDER — HYDROCODONE-ACETAMINOPHEN 5-325 MG PO TABS
1.0000 | ORAL_TABLET | ORAL | Status: DC | PRN
  Administered 2023-01-18: 1 via ORAL
  Administered 2023-01-18: 2 via ORAL
  Administered 2023-01-18: 1 via ORAL
  Administered 2023-01-18 – 2023-01-19 (×2): 2 via ORAL
  Filled 2023-01-18: qty 2
  Filled 2023-01-18 (×2): qty 1
  Filled 2023-01-18 (×2): qty 2

## 2023-01-18 MED ORDER — ENOXAPARIN SODIUM 40 MG/0.4ML IJ SOSY
40.0000 mg | PREFILLED_SYRINGE | INTRAMUSCULAR | Status: DC
  Administered 2023-01-18 – 2023-01-22 (×5): 40 mg via SUBCUTANEOUS
  Filled 2023-01-18 (×5): qty 0.4

## 2023-01-18 MED ORDER — POLYETHYLENE GLYCOL 3350 17 G PO PACK
17.0000 g | PACK | Freq: Every day | ORAL | Status: AC | PRN
  Administered 2023-01-20 – 2023-01-22 (×2): 17 g via ORAL
  Filled 2023-01-18 (×2): qty 1

## 2023-01-18 MED ORDER — LEVOTHYROXINE SODIUM 75 MCG PO TABS
75.0000 ug | ORAL_TABLET | Freq: Every day | ORAL | Status: DC
  Administered 2023-01-19 – 2023-01-26 (×8): 75 ug via ORAL
  Filled 2023-01-18 (×8): qty 1

## 2023-01-18 MED ORDER — INSULIN ASPART 100 UNIT/ML IJ SOLN: 0.0000 [IU] | Freq: Every day | INTRAMUSCULAR | Status: DC

## 2023-01-18 MED ORDER — DIPHENHYDRAMINE HCL 50 MG/ML IJ SOLN: 25.0000 mg | Freq: Four times a day (QID) | INTRAMUSCULAR | Status: DC | PRN

## 2023-01-18 NOTE — Assessment & Plan Note (Addendum)
Per primary team. °

## 2023-01-18 NOTE — Hospital Course (Signed)
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 She is admitted by general surgery to manage for postoperative infection.  We are consulted for managing diabetes.

## 2023-01-18 NOTE — Progress Notes (Signed)
Pharmacy Antibiotic Note  Tina Payne is a 57 y.o. female admitted on 01/17/2023 with wound infection.  Pharmacy has been consulted for Vancomycin dosing for 7 days.  Plan: Pt given Vancomycin 2000 mg once. Vancomycin 1000 mg IV Q 12 hrs. Goal AUC 400-550. Expected AUC: 524.2 SCr used: 0.93  Pharmacy will continue to follow and will adjust abx dosing whenever warranted.  Temp (24hrs), Avg:99.8 F (37.7 C), Min:99.8 F (37.7 C), Max:99.8 F (37.7 C)   Recent Labs  Lab 01/18/23 0010  WBC 19.4*  CREATININE 0.93  LATICACIDVEN 1.8    Estimated Creatinine Clearance: 76.9 mL/min (by C-G formula based on SCr of 0.93 mg/dL).    No Known Allergies  Antimicrobials this admission: 6/02 Cefepime >>  6/02 Flagyl >> x 7 days 6/02 Vancomycin >> x 7 days  Microbiology results: 06/02 BCx: Pending 06/02 UCx: Pending  06/02 BF Cx from JP Drain: Pending  Thank you for allowing pharmacy to be a part of this patient's care.  Otelia Sergeant, PharmD, MBA 01/18/2023 2:15 AM

## 2023-01-18 NOTE — Assessment & Plan Note (Addendum)
Diabetes education consulted

## 2023-01-18 NOTE — Assessment & Plan Note (Addendum)
Patient states she does not follow diabetic diet Complicated by postop infection in setting of mastectomy on 01/01/2023 Control infection per antibiotic and primary team Patient bicarb is 26 and anion gap is not elevated which is reassuring Insulin SSI with at bedtime coverage ordered Home metformin 500 mg with breakfast not resumed on admission Pending A1c in progress, patient may need to be started on long-acting insulin Goal inpatient blood glucose levels 140-180

## 2023-01-18 NOTE — Assessment & Plan Note (Signed)
-   Levothyroxine 75 mcg daily before breakfast resumed 

## 2023-01-18 NOTE — Assessment & Plan Note (Signed)
Per primary team. °

## 2023-01-18 NOTE — ED Notes (Signed)
Spoke with Gabriel Rung, RN, floor is ready for patient

## 2023-01-18 NOTE — Progress Notes (Signed)
Pt being followed by [facility] for Sepsis protocol. 

## 2023-01-18 NOTE — Progress Notes (Signed)
CODE SEPSIS - PHARMACY COMMUNICATION  **Broad Spectrum Antibiotics should be administered within 1 hour of Sepsis diagnosis**  Time Code Sepsis Called/Page Received: 0022  Antibiotics Ordered: Cefepime, Vancomycin, Flagyl  Time of 1st antibiotic administration: 0031  Otelia Sergeant, PharmD, [facility] 01/18/2023 12:26 AM

## 2023-01-18 NOTE — ED Provider Notes (Signed)
[facility] Provider Note    Event Date/Time   First MD Initiated Contact with Patient 01/17/23 2357     (approximate)  History   Chief Complaint: Post-op Problem (Mastecomy on 5/16 at [facility], C/o headache x 2 days, NV yesterday and milky white drainage to drain tube, Fever)  HPI  Tina Payne is a 57 y.o. female with a past medical history of anemia, anxiety, diabetes, invasive breast cancer status post right mastectomy 5/16 who presents to the emergency department for fever 101.9 per EMS tachycardic to 105 meeting sepsis criteria.  According to the patient she had a mastectomy 5/16 by Dr. Tonna Boehringer of general surgery.  She has a surgical drain in place and states over the past 3 days the drain has been draining a "milky" discharge and she has been feeling more weak.  States subjective chills found to be febrile to 101.9 per EMS.  States some pain at times in the right breast.  No abdominal pain no vomiting or diarrhea no urinary symptoms no cough or shortness of breath.  Physical Exam   Triage Vital Signs: ED Triage Vitals [01/18/23 0005]  Enc Vitals Group     BP 133/81     Pulse Rate (!) 104     Resp 16     Temp 99.8 F (37.7 C)     Temp Source Oral     SpO2 95 %     Weight      Height      Head Circumference      Peak Flow      Pain Score      Pain Loc      Pain Edu?      Excl. in GC?     Most recent vital signs: Vitals:   01/18/23 0005  BP: 133/81  Pulse: (!) 104  Resp: 16  Temp: 99.8 F (37.7 C)  SpO2: 95%    General: Awake, no distress.  CV:  Good peripheral perfusion.  Regular rate and rhythm around 100 bpm Resp:  Normal effort.  Equal breath sounds bilaterally.  Abd:  No distention.  Soft, nontender.  No rebound or guarding. Other:  Well-appearing incision on the right breast however does have some mild induration around the incision possible hematoma/seroma/abscess.  No drainage noted from the skin no significant erythema.   There is a surgical drain in place with exudative drainage.  ED Results / Procedures / Treatments   EKG  EKG viewed and interpreted by myself shows sinus tachycardia 105 bpm with a narrow QRS, normal axis, normal intervals, no concerning ST changes.  RADIOLOGY  I have reviewed and interpreted the CT images.  Patient appears to have a breast expander in place difficult to assess for any obvious infection. Radiology states 10 mm thickness area of fluid.   MEDICATIONS ORDERED IN ED: Medications  lactated ringers bolus 1,000 mL (has no administration in time range)  ceFEPIme (MAXIPIME) 2 g in sodium chloride 0.9 % 100 mL IVPB (has no administration in time range)  metroNIDAZOLE (FLAGYL) IVPB 500 mg (has no administration in time range)  vancomycin (VANCOCIN) IVPB 1000 mg/200 mL premix (has no administration in time range)     IMPRESSION / MDM / ASSESSMENT AND PLAN / ED COURSE  I reviewed the triage vital signs and the nursing notes.  Patient's presentation is most consistent with acute presentation with potential threat to life or bodily function.  Patient presents emergency department approximately 2 weeks after  a right-sided mastectomy now with increased drainage from her surgical drain fever and tachycardia meeting sepsis criteria.  Will start the patient on broad-spectrum antibiotics and blood cultures labs and obtain CT imaging of the chest to evaluate for likely or possible postsurgical abscess/infection.  No shortness of breath no chest pain no pleuritic pain.  Patient's lab work has resulted showing significant leukocytosis of 19,000, chemistry shows no significant findings as mild hyperglycemia.  Lactic acid of 1.8.  Urinalysis shows no concerning findings.  CT imaging is concerning for fluid collection in the right breast measuring 10 mm in thickness possibly indicating postoperative abscess.  Given the patient's elevated white blood cell count fever tachycardia meeting sepsis  criteria we will continue IV antibiotics.  I spoke to Dr. Claudine Mouton of general surgery who will be admitting to his service for further workup and treatment.  Patient agreeable to plan of care.  CRITICAL CARE Performed by: Minna Antis   Total critical care time: 30 minutes  Critical care time was exclusive of separately billable procedures and treating other patients.  Critical care was necessary to treat or prevent imminent or life-threatening deterioration.  Critical care was time spent personally by me on the following activities: development of treatment plan with patient and/or surrogate as well as nursing, discussions with consultants, evaluation of patient's response to treatment, examination of patient, obtaining history from patient or surrogate, ordering and performing treatments and interventions, ordering and review of laboratory studies, ordering and review of radiographic studies, pulse oximetry and re-evaluation of patient's condition.   FINAL CLINICAL IMPRESSION(S) / ED DIAGNOSES   Sepsis Fever Postoperative abscess  Note:  This document was prepared using Dragon voice recognition software and may include unintentional dictation errors.   Minna Antis, MD 01/18/23 [phone]
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
## 2023-01-18 NOTE — Progress Notes (Signed)
PHARMACY -  BRIEF ANTIBIOTIC NOTE   Pharmacy has received consult(s) for Vancomycin from an ED provider.  The patient's profile has been reviewed for ht/wt/allergies/indication/available labs.    One time order(s) placed for Vancomycin 2 gm per pt wt: 88.9 kg from 01/02/23.  Further antibiotics/pharmacy consults should be ordered by admitting physician if indicated.                       Thank you, Otelia Sergeant, PharmD, [facility] 01/18/2023 12:25 AM

## 2023-01-18 NOTE — Assessment & Plan Note (Signed)
Resumed home atorvastatin 20 mg qhs

## 2023-01-18 NOTE — Progress Notes (Signed)
PHARMACY NOTE:  ANTIMICROBIAL RENAL DOSAGE ADJUSTMENT  Current antimicrobial regimen includes a mismatch between antimicrobial dosage and estimated renal function.  As per policy approved by the Pharmacy & Therapeutics and Medical Executive Committees, the antimicrobial dosage will be adjusted accordingly.  Current antimicrobial dosage:  Cefepime 1 g IV q12H  Indication: Wound infection  Renal Function: Estimated Creatinine Clearance: 76.9 mL/min (by C-G formula based on SCr of 0.93 mg/dL).  Antimicrobial dosage has been changed to:  Cefepime 2 g IV q8H  Additional comments: N/A  Thank you for allowing pharmacy to be a part of this patient's care.  Will M. Dareen Piano, PharmD PGY-1 Pharmacy Resident 01/18/2023 12:01 PM
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
## 2023-01-19 DIAGNOSIS — E663 Overweight: Secondary | ICD-10-CM | POA: Diagnosis not present

## 2023-01-19 DIAGNOSIS — E1165 Type 2 diabetes mellitus with hyperglycemia: Secondary | ICD-10-CM | POA: Diagnosis not present

## 2023-01-19 LAB — CBC
HCT: 34.5 % — ABNORMAL LOW (ref 36.0–46.0)
Hemoglobin: 10.8 g/dL — ABNORMAL LOW (ref 12.0–15.0)
MCH: 24.8 pg — ABNORMAL LOW (ref 26.0–34.0)
MCHC: 31.3 g/dL (ref 30.0–36.0)
MCV: 79.3 fL — ABNORMAL LOW (ref 80.0–100.0)
Platelets: 316 10*3/uL (ref 150–400)
RBC: 4.35 MIL/uL (ref 3.87–5.11)
RDW: 13.7 % (ref 11.5–15.5)
WBC: 13.2 10*3/uL — ABNORMAL HIGH (ref 4.0–10.5)
nRBC: 0 % (ref 0.0–0.2)

## 2023-01-19 LAB — GLUCOSE, CAPILLARY
Glucose-Capillary: 178 mg/dL — ABNORMAL HIGH (ref 70–99)
Glucose-Capillary: 227 mg/dL — ABNORMAL HIGH (ref 70–99)
Glucose-Capillary: 268 mg/dL — ABNORMAL HIGH (ref 70–99)
Glucose-Capillary: 139 mg/dL — ABNORMAL HIGH (ref 70–99)

## 2023-01-19 LAB — CREATININE, SERUM
Creatinine, Ser: 0.77 mg/dL (ref 0.44–1.00)
GFR, Estimated: >60 mL/min (ref >60)

## 2023-01-19 LAB — CULTURE, BLOOD (ROUTINE X 2)

## 2023-01-19 LAB — URINE CULTURE: Culture: <10000 — AB

## 2023-01-19 LAB — BODY FLUID CULTURE W GRAM STAIN: Special Requests: NORMAL

## 2023-01-19 MED ORDER — INSULIN GLARGINE-YFGN 100 UNIT/ML ~~LOC~~ SOLN
8.0000 [IU] | Freq: Every day | SUBCUTANEOUS | Status: DC
  Filled 2023-01-19 (×2): qty 0.08

## 2023-01-19 MED ORDER — INSULIN ASPART 100 UNIT/ML IJ SOLN
3.0000 [IU] | Freq: Three times a day (TID) | INTRAMUSCULAR | Status: DC
  Administered 2023-01-19 – 2023-01-23 (×13): 3 [IU] via SUBCUTANEOUS
  Filled 2023-01-19 (×13): qty 1

## 2023-01-19 MED ORDER — METHOCARBAMOL 500 MG PO TABS
500.0000 mg | ORAL_TABLET | Freq: Three times a day (TID) | ORAL | Status: DC | PRN
  Administered 2023-01-19 – 2023-01-26 (×10): 500 mg via ORAL
  Filled 2023-01-19 (×10): qty 1

## 2023-01-19 MED ORDER — INSULIN ASPART 100 UNIT/ML IJ SOLN
0.0000 [IU] | Freq: Three times a day (TID) | INTRAMUSCULAR | Status: DC
  Administered 2023-01-19: 5 [IU] via SUBCUTANEOUS
  Administered 2023-01-19: 3 [IU] via SUBCUTANEOUS
  Administered 2023-01-20: 1 [IU] via SUBCUTANEOUS
  Administered 2023-01-20: 2 [IU] via SUBCUTANEOUS
  Administered 2023-01-20: 7 [IU] via SUBCUTANEOUS
  Administered 2023-01-21: 1 [IU] via SUBCUTANEOUS
  Administered 2023-01-21: 2 [IU] via SUBCUTANEOUS
  Administered 2023-01-21: 3 [IU] via SUBCUTANEOUS
  Administered 2023-01-22: 2 [IU] via SUBCUTANEOUS
  Administered 2023-01-22: 3 [IU] via SUBCUTANEOUS
  Administered 2023-01-22: 2 [IU] via SUBCUTANEOUS
  Administered 2023-01-23: 5 [IU] via SUBCUTANEOUS
  Administered 2023-01-23: 7 [IU] via SUBCUTANEOUS
  Administered 2023-01-24: 2 [IU] via SUBCUTANEOUS
  Administered 2023-01-24: 3 [IU] via SUBCUTANEOUS
  Administered 2023-01-24: 2 [IU] via SUBCUTANEOUS
  Administered 2023-01-25: 7 [IU] via SUBCUTANEOUS
  Administered 2023-01-25: 1 [IU] via SUBCUTANEOUS
  Administered 2023-01-26: 2 [IU] via SUBCUTANEOUS
  Administered 2023-01-26: 3 [IU] via SUBCUTANEOUS
  Filled 2023-01-19 (×20): qty 1

## 2023-01-19 MED ORDER — INSULIN ASPART 100 UNIT/ML IJ SOLN
0.0000 [IU] | Freq: Every day | INTRAMUSCULAR | Status: DC
  Administered 2023-01-23: 5 [IU] via SUBCUTANEOUS
  Filled 2023-01-19: qty 1

## 2023-01-19 NOTE — Progress Notes (Signed)
  Progress Note   Patient: Tina Payne WJX:[id number] DOB: 10/26/1965 DOA: 01/17/2023     1 DOS: the patient was seen and examined on 01/19/2023   Brief hospital course: Ms. 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 She is admitted by general surgery to manage for postoperative infection.  We are consulted for managing diabetes.   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 Started scheduled long-acting insulin, Premeal insulin and sliding scale insulin.  Adjust dose as needed.  Postop infection. Sepsis. Followed and treated by general surgery.  Overweight. Diet and excise.    Subjective: Patient currently has no complaint.  Physical Exam: Vitals:   01/18/23 2000 01/18/23 2023 01/19/23 0414 01/19/23 0739  BP: 123/72 131/86 129/73 (!) 142/79  Pulse: 78 80 85 78  Resp: 16 18 17 18   Temp:  98.4 F (36.9 C) 98.3 F (36.8 C) 98.4 F (36.9 C)  TempSrc:   Oral Oral  SpO2: 93% 93% 91% 96%  Weight:  84.5 kg    Height:  5\' 8"  (1.727 m)     General exam: Appears calm and comfortable  Respiratory system: Clear to auscultation. Respiratory effort normal. Cardiovascular system: S1 & S2 heard, RRR. No JVD, murmurs, rubs, gallops or clicks. No pedal edema. Gastrointestinal system: Abdomen is  nondistended, soft and nontender. No organomegaly or masses felt. Normal bowel sounds heard. Central nervous system: Alert and oriented. No focal neurological deficits. Extremities: Symmetric 5 x 5 power. Skin: No rashes, lesions or ulcers Psychiatry: Judgement and insight appear normal. Mood & affect appropriate.    Data Reviewed:  Lab results reviewed.  Family Communication: None  Disposition: Status is: Inpatient Remains inpatient appropriate because: Lab results reviewed.     Time spent: 35 minutes  Author: Marrion Coy, MD 01/19/2023 12:16 PM  For on call review [email or web address].

## 2023-01-19 NOTE — Progress Notes (Signed)
Subjective:  CC: Tina Payne is a 57 y.o. female  Hospital stay day 1,   infected mastectomy site s/p expander placement  HPI: No acute issues overnight.  Still has decent tenderness over area. Going to try some food to see if tolerates.  ROS:  General: Denies weight loss, weight gain, fatigue, fevers, chills, and night sweats. Heart: Denies chest pain, palpitations, racing heart, irregular heartbeat, leg pain or swelling, and decreased activity tolerance. Respiratory: Denies breathing difficulty, shortness of breath, wheezing, cough, and sputum. GI: Denies change in appetite, heartburn, nausea, vomiting, constipation, diarrhea, and blood in stool. GU: Denies difficulty urinating, pain with urinating, urgency, frequency, blood in urine.   Objective:   Temp:  [98.3 F (36.8 C)-99 F (37.2 C)] 98.4 F (36.9 C) (06/03 0739) Pulse Rate:  [76-87] 78 (06/03 0739) Resp:  [12-18] 18 (06/03 0739) BP: (111-142)/(72-86) 142/79 (06/03 0739) SpO2:  [91 %-98 %] 96 % (06/03 0739) Weight:  [84.5 kg] 84.5 kg (06/02 2023)     Height: 5\' 8"  (172.7 cm) Weight: 84.5 kg BMI (Calculated): 28.33   Intake/Output this shift:   Intake/Output Summary (Last 24 hours) at 01/19/2023 0846 Last data filed at 01/19/2023 0600 Gross per 24 hour  Intake 454.7 ml  Output 55 ml  Net 399.7 ml    Constitutional :  alert, cooperative, appears stated age, and no distress  Respiratory:  clear to auscultation bilaterally  Cardiovascular:  regular rate and rhythm  Gastrointestinal: soft, non-tender; bowel sounds normal; no masses,  no organomegaly.   Skin: Cool and moist. Chaperone present for exam.  Right mastectomy site with obvious erythema and TTP mostly medial aspect.  Obvious purulent drainage in JP drain.  Psychiatric: Normal affect, non-agitated, not confused       LABS:     Latest Ref Rng & Units 01/18/2023   12:10 AM 01/02/2023    5:19 AM 11/25/2022   10:48 AM  CMP  Glucose 70 - 99 mg/dL 324  401   027   BUN 6 - 20 mg/dL 13  15  15    Creatinine 0.44 - 1.00 mg/dL 2.53  6.64  4.03   Sodium 135 - 145 mmol/L 132  141  137   Potassium 3.5 - 5.1 mmol/L 3.7  4.2  4.0   Chloride 98 - 111 mmol/L 98  108  105   CO2 22 - 32 mmol/L 26  23  23    Calcium 8.9 - 10.3 mg/dL 9.2  9.3  9.6   Total Protein 6.5 - 8.1 g/dL 7.3   7.7   Total Bilirubin 0.3 - 1.2 mg/dL 0.7   0.5   Alkaline Phos 38 - 126 U/L 135   129   AST 15 - 41 U/L 25   35   ALT 0 - 44 U/L 39   45       Latest Ref Rng & Units 01/18/2023   12:10 AM 01/02/2023    5:19 AM 11/25/2022   10:48 AM  CBC  WBC 4.0 - 10.5 K/uL 19.4  10.6  5.8   Hemoglobin 12.0 - 15.0 g/dL 47.4  25.9  56.3   Hematocrit 36.0 - 46.0 % 36.4  37.6  38.0   Platelets 150 - 400 K/uL 304  338  328     RADS: N/a Assessment:   S/p infected mastectomy site s/p expander placement.  still very tender and 55ml of purulent fluid in drain recorded yesterday. has another 20ml or so in drain this am.  labs pending this am, but prefer to keep her inhouse until wbc normalizes on IV abx.  Plastics updated.    Appreciate hospitalist recs re: glucose control.  labs/images/medications/previous chart entries reviewed personally and relevant changes/updates noted above.

## 2023-01-20 ENCOUNTER — Other Ambulatory Visit (HOSPITAL_COMMUNITY): Payer: Self-pay

## 2023-01-20 DIAGNOSIS — E1165 Type 2 diabetes mellitus with hyperglycemia: Secondary | ICD-10-CM | POA: Diagnosis not present

## 2023-01-20 DIAGNOSIS — E876 Hypokalemia: Secondary | ICD-10-CM

## 2023-01-20 LAB — HIV ANTIBODY (ROUTINE TESTING W REFLEX): HIV Screen 4th Generation wRfx: NONREACTIVE

## 2023-01-20 LAB — HEMOGLOBIN A1C
Hgb A1c MFr Bld: 9.9 % — ABNORMAL HIGH (ref 4.8–5.6)
Mean Plasma Glucose: 237 mg/dL

## 2023-01-20 LAB — GLUCOSE, CAPILLARY
Glucose-Capillary: 176 mg/dL — ABNORMAL HIGH (ref 70–99)
Glucose-Capillary: 311 mg/dL — ABNORMAL HIGH (ref 70–99)
Glucose-Capillary: 125 mg/dL — ABNORMAL HIGH (ref 70–99)
Glucose-Capillary: 127 mg/dL — ABNORMAL HIGH (ref 70–99)

## 2023-01-20 LAB — BASIC METABOLIC PANEL
Anion gap: 11 (ref 5–15)
BUN: 12 mg/dL (ref 6–20)
CO2: 24 mmol/L (ref 22–32)
Calcium: 9.1 mg/dL (ref 8.9–10.3)
Chloride: 102 mmol/L (ref 98–111)
Creatinine, Ser: 0.76 mg/dL (ref 0.44–1.00)
GFR, Estimated: >60 mL/min (ref >60)
Glucose, Bld: 159 mg/dL — ABNORMAL HIGH (ref 70–99)
Potassium: 3.3 mmol/L — ABNORMAL LOW (ref 3.5–5.1)
Sodium: 137 mmol/L (ref 135–145)

## 2023-01-20 LAB — CBC
HCT: 35.3 % — ABNORMAL LOW (ref 36.0–46.0)
Hemoglobin: 10.8 g/dL — ABNORMAL LOW (ref 12.0–15.0)
MCH: 24.3 pg — ABNORMAL LOW (ref 26.0–34.0)
MCHC: 30.6 g/dL (ref 30.0–36.0)
MCV: 79.5 fL — ABNORMAL LOW (ref 80.0–100.0)
Platelets: 366 10*3/uL (ref 150–400)
RBC: 4.44 MIL/uL (ref 3.87–5.11)
RDW: 13.7 % (ref 11.5–15.5)
WBC: 10.8 10*3/uL — ABNORMAL HIGH (ref 4.0–10.5)
nRBC: 0 % (ref 0.0–0.2)

## 2023-01-20 LAB — CULTURE, BLOOD (ROUTINE X 2)

## 2023-01-20 MED ORDER — INSULIN GLARGINE-YFGN 100 UNIT/ML ~~LOC~~ SOLN
12.0000 [IU] | Freq: Every day | SUBCUTANEOUS | Status: DC
  Filled 2023-01-20: qty 0.12

## 2023-01-20 MED ORDER — CEFAZOLIN SODIUM-DEXTROSE 2-4 GM/100ML-% IV SOLN
2.0000 g | Freq: Three times a day (TID) | INTRAVENOUS | Status: DC
  Administered 2023-01-20 – 2023-01-22 (×7): 2 g via INTRAVENOUS
  Filled 2023-01-20 (×9): qty 100

## 2023-01-20 MED ORDER — POTASSIUM CHLORIDE CRYS ER 20 MEQ PO TBCR
40.0000 meq | EXTENDED_RELEASE_TABLET | ORAL | Status: AC
  Administered 2023-01-20 (×2): 40 meq via ORAL
  Filled 2023-01-20 (×2): qty 2

## 2023-01-20 NOTE — TOC Benefit Eligibility Note (Signed)
Patient Advocate Encounter  Insurance verification completed.    The patient is currently admitted and upon discharge could be taking Lantus Pen.  The current 30 day co-pay is $4.00.   The patient is currently admitted and upon discharge could be taking Novolog FlexPen.  The current 30 day co-pay is $4.00.   The patient is insured through [employer] [place] [employer]   This test claim was processed through [facility]- copay amounts may vary at other pharmacies due to pharmacy/plan contracts, or as the patient moves through the different stages of their insurance plan.  Herbert Aguinaldo, CPHT Pharmacy Patient Advocate Specialist [facility] Patient Advocate Team Direct Number: [phone]  Fax: [phone]       

## 2023-01-20 NOTE — Progress Notes (Signed)
Subjective:  CC: Tina Payne is a 57 y.o. female  Hospital stay day 2,   infected mastectomy site s/p expander placement  HPI: No acute issues overnight.  Still has decent tenderness and drainage. No report of nausea.  ROS:  General: Denies weight loss, weight gain, fatigue, fevers, chills, and night sweats. Heart: Denies chest pain, palpitations, racing heart, irregular heartbeat, leg pain or swelling, and decreased activity tolerance. Respiratory: Denies breathing difficulty, shortness of breath, wheezing, cough, and sputum. GI: Denies change in appetite, heartburn, nausea, vomiting, constipation, diarrhea, and blood in stool. GU: Denies difficulty urinating, pain with urinating, urgency, frequency, blood in urine.   Objective:   Temp:  [97.9 F (36.6 C)-99.2 F (37.3 C)] 97.9 F (36.6 C) (06/04 0843) Pulse Rate:  [68-75] 69 (06/04 0843) Resp:  [18] 18 (06/04 0843) BP: (119-127)/(69-96) 127/70 (06/04 0843) SpO2:  [96 %-99 %] 99 % (06/04 0843)     Height: 5\' 8"  (172.7 cm) Weight: 84.5 kg BMI (Calculated): 28.33   Intake/Output this shift:   Intake/Output Summary (Last 24 hours) at 01/20/2023 1507 Last data filed at 01/20/2023 1404 Gross per 24 hour  Intake 718 ml  Output 38 ml  Net 680 ml    Constitutional :  alert, cooperative, appears stated age, and no distress  Respiratory:  clear to auscultation bilaterally  Cardiovascular:  regular rate and rhythm  Gastrointestinal: soft, non-tender; bowel sounds normal; no masses,  no organomegaly.   Skin: Cool and moist. Chaperone present for exam.  Right mastectomy site with obvious erythema and TTP mostly medial aspect.  Obvious purulent drainage in JP drain.  Psychiatric: Normal affect, non-agitated, not confused       LABS:     Latest Ref Rng & Units 01/20/2023    5:04 AM 01/19/2023    8:50 AM 01/18/2023   12:10 AM  CMP  Glucose 70 - 99 mg/dL 161   096   BUN 6 - 20 mg/dL 12   13   Creatinine 0.45 - 1.00 mg/dL 4.09   8.11  9.14   Sodium 135 - 145 mmol/L 137   132   Potassium 3.5 - 5.1 mmol/L 3.3   3.7   Chloride 98 - 111 mmol/L 102   98   CO2 22 - 32 mmol/L 24   26   Calcium 8.9 - 10.3 mg/dL 9.1   9.2   Total Protein 6.5 - 8.1 g/dL   7.3   Total Bilirubin 0.3 - 1.2 mg/dL   0.7   Alkaline Phos 38 - 126 U/L   135   AST 15 - 41 U/L   25   ALT 0 - 44 U/L   39       Latest Ref Rng & Units 01/20/2023    5:04 AM 01/19/2023    8:52 AM 01/18/2023   12:10 AM  CBC  WBC 4.0 - 10.5 K/uL 10.8  13.2  19.4   Hemoglobin 12.0 - 15.0 g/dL 78.2  95.6  21.3   Hematocrit 36.0 - 46.0 % 35.3  34.5  36.4   Platelets 150 - 400 K/uL 366  316  304     RADS: N/a Assessment:   S/p infected mastectomy site s/p expander placement.  Are continues to drain moderate amount of purulence, but wbc fortunately decreasing.  Plastics still recommends continuing present management as long as overlying skin has no significant change in appearance.   Appreciate hospitalist recs re: glucose control.  labs/images/medications/previous chart entries reviewed personally  and relevant changes/updates noted above.

## 2023-01-20 NOTE — Progress Notes (Signed)
  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 She is admitted by general surgery to manage for postoperative infection.  We are consulted for managing diabetes. 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 Patient really did not take the p.m. insulin glargine last night, discussed with her, she will need to take it tonight.  Glucose running high to 300s, insulin glargine at 12 units will be more appropriate at this time.  Continue scheduled and sliding scale NovoLog.  Hypokalemia. Repleted orally.   Postop infection. Sepsis. Followed and treated by general surgery.   Overweight. Diet and excise.     Subjective: Patient doing well, no complaint.  Physical Exam: Vitals:   01/19/23 1804 01/19/23 1944 01/20/23 0358 01/20/23 0843  BP: (!) 127/96 123/69 119/89 127/70  Pulse: 68 73 75 69  Resp: 18 18 18 18   Temp: 98.2 F (36.8 C) 98.5 F (36.9 C) 99.2 F (37.3 C) 97.9 F (36.6 C)  TempSrc:  Oral Oral   SpO2: 97% 97% 96% 99%  Weight:      Height:        General exam: Appears calm and comfortable  Respiratory system: Clear to auscultation. Respiratory effort normal. Cardiovascular system: S1 & S2 heard, RRR. No JVD, murmurs, rubs, gallops or clicks. No pedal edema. Gastrointestinal system: Abdomen is nondistended, soft and nontender. No organomegaly or masses felt. Normal bowel sounds heard. Central nervous system: Alert and oriented. No focal neurological deficits. Extremities: Symmetric 5 x 5 power. Skin: No rashes, lesions or ulcers Psychiatry: Judgement and insight appear normal. Mood & affect appropriate.    Data Reviewed:  Reviewed lab results  Family Communication: None  Disposition: Status is: Inpatient Remains inpatient appropriate because: Per general surgery.     Time spent: 35 minutes  Author: Marrion Coy, MD 01/20/2023 12:45 PM  For on call review [email or web address].

## 2023-01-20 NOTE — Inpatient Diabetes Management (Addendum)
Inpatient Diabetes Program Recommendations  AACE/ADA: New Consensus Statement on Inpatient Glycemic Control (2015)  Target Ranges:  Prepandial:   less than 140 mg/dL      Peak postprandial:   less than 180 mg/dL (1-2 hours)      Critically ill patients:  140 - 180 mg/dL    Latest Reference Range & Units 01/18/23 11:42  Hemoglobin A1C 4.8 - 5.6 % 9.9 (H)  237 mg/dl  (H): Data is abnormally high  Latest Reference Range & Units 01/19/23 07:38 01/19/23 12:09 01/19/23 16:36 01/19/23 21:53  Glucose-Capillary 70 - 99 mg/dL 161 (H) 096 (H)  6 units Novolog  268 (H)  8 units Novolog  139 (H)    Pt Refused Semglee 8 units  (H): Data is abnormally high  Latest Reference Range & Units 01/20/23 08:28  Glucose-Capillary 70 - 99 mg/dL 045 (H)  (H): Data is abnormally high   Admit with:  Post-operative infection  Mastectomy on 01/01/2023   History: DM2, Breast Cancer  Home DM Meds: Metformin 500 mg daily  Current Orders: Semglee 8 units QHS     Novolog Sensitive Correction Scale/ SSI (0-9 units) TID AC + HS     Novolog 3 units TID with meals    ENDO: Dr. Tedd Sias with [facility] Last Seen 11/13/2022 A1c at that visit was 7.1% Pt was instructed to take the following:  Metformin 500 mg daily Trulicity 4.5 mg Qweek    MD- Note that pt Refused the Semglee 8 units QHS last PM.  Last saw her ENDO in March 2024.  Is supposed to be taking Metformin + Trulicity.   Addendum 12:30pm--Met w/ pt at bedside.  Pt told me she has been taking the Metformin daily but has been out of the Trulicity for 6 weeks due to issues with her insurance.  Has CBG meter at home.  Sees Dr. Tedd Sias for ENDO.  Pt told me Dr. Chipper Herb (hospital MD) plans to send her home on long-acting insulin + Novolog SSI TID with follow up with her ENDO--Will stop the Trulicity and Metformin for now.  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  We verbally reviewed all the steps on how to use an insulin pen and pt stated comfort with insulin pens as she gives them at her work.  We reviewed storage of insulin, disposal of needles, rotation of sites, signs/symptoms of HYPO and treatment of HYPO.  I also reviewed with pt what Long-acting and quick acting insulins are, how they work, when to take, importance of timing.  Will send request to OP pharmacy to check best coverage of both basal and bolus insulins with pt's insurance.  Pt has follow up appt with Dr. Tedd Sias (ENDO) on July 1st.     --Will follow patient during hospitalization--  Ambrose Finland RN, MSN, CDCES Diabetes Coordinator Inpatient Glycemic Control Team Team Pager: [phone] (8a-5p)

## 2023-01-21 ENCOUNTER — Inpatient Hospital Stay: Payer: 59 | Admitting: Occupational Therapy

## 2023-01-21 ENCOUNTER — Telehealth (HOSPITAL_COMMUNITY): Payer: Self-pay | Admitting: Pharmacy Technician

## 2023-01-21 ENCOUNTER — Encounter: Payer: Self-pay | Admitting: Oncology

## 2023-01-21 ENCOUNTER — Other Ambulatory Visit (HOSPITAL_COMMUNITY): Payer: Self-pay

## 2023-01-21 ENCOUNTER — Encounter: Payer: Medicaid Other | Admitting: Physician Assistant

## 2023-01-21 DIAGNOSIS — E063 Autoimmune thyroiditis: Secondary | ICD-10-CM

## 2023-01-21 DIAGNOSIS — C50911 Malignant neoplasm of unspecified site of right female breast: Secondary | ICD-10-CM | POA: Diagnosis not present

## 2023-01-21 DIAGNOSIS — T8140XA Infection following a procedure, unspecified, initial encounter: Secondary | ICD-10-CM

## 2023-01-21 DIAGNOSIS — E876 Hypokalemia: Secondary | ICD-10-CM | POA: Diagnosis not present

## 2023-01-21 LAB — BASIC METABOLIC PANEL
Anion gap: 8 (ref 5–15)
BUN: 13 mg/dL (ref 6–20)
CO2: 25 mmol/L (ref 22–32)
Calcium: 9.2 mg/dL (ref 8.9–10.3)
Chloride: 105 mmol/L (ref 98–111)
Creatinine, Ser: 0.65 mg/dL (ref 0.44–1.00)
GFR, Estimated: >60 mL/min (ref >60)
Glucose, Bld: 145 mg/dL — ABNORMAL HIGH (ref 70–99)
Potassium: 3.9 mmol/L (ref 3.5–5.1)
Sodium: 138 mmol/L (ref 135–145)

## 2023-01-21 LAB — CBC
HCT: 34.4 % — ABNORMAL LOW (ref 36.0–46.0)
Hemoglobin: 10.6 g/dL — ABNORMAL LOW (ref 12.0–15.0)
MCH: 24.6 pg — ABNORMAL LOW (ref 26.0–34.0)
MCHC: 30.8 g/dL (ref 30.0–36.0)
MCV: 79.8 fL — ABNORMAL LOW (ref 80.0–100.0)
Platelets: 387 10*3/uL (ref 150–400)
RBC: 4.31 MIL/uL (ref 3.87–5.11)
RDW: 13.8 % (ref 11.5–15.5)
WBC: 11.4 10*3/uL — ABNORMAL HIGH (ref 4.0–10.5)
nRBC: 0.2 % (ref 0.0–0.2)

## 2023-01-21 LAB — GLUCOSE, CAPILLARY
Glucose-Capillary: 167 mg/dL — ABNORMAL HIGH (ref 70–99)
Glucose-Capillary: 237 mg/dL — ABNORMAL HIGH (ref 70–99)
Glucose-Capillary: 139 mg/dL — ABNORMAL HIGH (ref 70–99)
Glucose-Capillary: 172 mg/dL — ABNORMAL HIGH (ref 70–99)

## 2023-01-21 LAB — BODY FLUID CULTURE W GRAM STAIN

## 2023-01-21 LAB — MAGNESIUM: Magnesium: 1.9 mg/dL (ref 1.7–2.4)

## 2023-01-21 MED ORDER — INSULIN GLARGINE-YFGN 100 UNIT/ML ~~LOC~~ SOLN
12.0000 [IU] | Freq: Every day | SUBCUTANEOUS | Status: DC
  Administered 2023-01-21 – 2023-01-25 (×5): 12 [IU] via SUBCUTANEOUS
  Filled 2023-01-21 (×6): qty 0.12

## 2023-01-21 NOTE — Evaluation (Signed)
Occupational Therapy Evaluation Patient Details Name: Tina Payne MRN: [id number] DOB: 10-Feb-1966 Today's Date: 01/21/2023   History of Present Illness Tina Payne is a 57 year old female with hypothyroid, history of invasive ductal carcinoma on the right status post mastectomy on 01/01/2023 with immediate reconstruction.  Tissue expander in place with a single drain present.  Also noted associated nausea and vomiting beginning yesterday. Blood sugars elevated on admission and found to have fever of 101.9.  CT of the chest confirms a thin layer of drainage persistent medial to the current drain. Sepsis protocol initiated in ED, with leukocytosis of 19,000, and hyperglycemia.   Clinical Impression   Tina Payne was seen for OT evaluation this date. Pt reports she is active and independent at baseline. She works out of her home and is independent with mobility. She denies falls history in the last year. Despite her post-operative infection, she reports she is able to maintain functional independence during BADL management including getting up ad lib in her room, toileting, dressing, etc. Independently. Pt primary concern is management of her operative extremity and minimizing risk of lymph edema s/p mastectomy. Pt reports she is scheduled to follow up with an OP hand therapist next week and wanted to ensure she is doing everything she can to maximize her safety and recover fully from this procedure. Pt educated on log-roll technique for bed mobility, diaphragmatic breathing, and precautions this date. Pt is not candidate for lymph edema mobilization at this time in setting of acute infection. She can follow up with OP OT upon hospital DC. All education and assessment completed at time of initial evaluation. No further skilled OT needs identified. Will sign off at this time. Please re-consult if additional OT needs arise during this hospital stay.      Recommendations for follow up therapy are  one component of a multi-disciplinary discharge planning process, led by the attending physician.  Recommendations may be updated based on patient status, additional functional criteria and insurance authorization.   Assistance Recommended at Discharge PRN  Patient can return home with the following Assist for transportation;Help with stairs or ramp for entrance;Assistance with cooking/housework    Functional Status Assessment  Patient has not had a recent decline in their functional status  Equipment Recommendations  None recommended by OT    Recommendations for Other Services       Precautions / Restrictions Precautions Precautions: Fall Precaution Comments: RUE lifting restrictions s/p mastectomy with re-construction Restrictions Weight Bearing Restrictions: No Other Position/Activity Restrictions: RUE lifting restrictions s/p mastectomy with re-construction      Mobility Bed Mobility Overal bed mobility: Modified Independent Bed Mobility: Rolling, Sidelying to Sit, Sit to Sidelying Rolling: Modified independent (Device/Increase time) Sidelying to sit: Modified independent (Device/Increase time)     Sit to sidelying: Modified independent (Device/Increase time) General bed mobility comments: Educated on log roll technique to maximize safety, comfort, and independence.    Transfers Overall transfer level: Independent Equipment used: None                      Balance Overall balance assessment: No apparent balance deficits (not formally assessed)                                         ADL either performed or assessed with clinical judgement   ADL Overall ADL's : At baseline  General ADL Comments: Pt presents at or near baseline level of functional independence. She endorses managing dressing, bathing, toileting, etc. independently in room. Her primary functional concern is management of  potential lymph edema s/p her mastectomy.     Vision Baseline Vision/History: 1 Wears glasses Ability to See in Adequate Light: 1 Impaired Patient Visual Report: No change from baseline       Perception     Praxis      Pertinent Vitals/Pain Pain Assessment Pain Assessment: Faces Faces Pain Scale: Hurts little more Pain Location: RUE with mobility Pain Descriptors / Indicators: Aching, Sore, Discomfort Pain Intervention(s): Limited activity within patient's tolerance, Monitored during session     Hand Dominance Right   Extremity/Trunk Assessment Upper Extremity Assessment Upper Extremity Assessment: RUE deficits/detail RUE Deficits / Details: s/p mastectomy with post-op infection. BUE appear grossly equal, no notable swelling/edema at this time. Pt endorses some decreased sensation in the bicep area, but otherwise feels RUE is [facility].   Lower Extremity Assessment Lower Extremity Assessment: Overall WFL for tasks assessed   Cervical / Trunk Assessment Cervical / Trunk Assessment: Normal   Communication Communication Communication: No difficulties   Cognition Arousal/Alertness: Awake/alert Behavior During Therapy: WFL for tasks assessed/performed Overall Cognitive Status: Within Functional Limits for tasks assessed                                 General Comments: Pleasant, conversational, very motiviated and eager to learn compensatory strategies for ADL management.     General Comments       Exercises Other Exercises Other Exercises: Pt educated on role of OT in acute setting, safety, bed mobility techniques, and basic considerations for lymph edema management s/p mastectomy.   Shoulder Instructions      Home Living Family/patient expects to be discharged to:: Private residence Living Arrangements: Spouse/significant other Available Help at Discharge: Family;Available 24 hours/day Type of Home: House Home Access: Ramped entrance     Home Layout:  One level     Bathroom Shower/Tub: Walk-in shower         Home Equipment: Grab bars - tub/shower;Shower Counsellor (2 wheels)   Additional Comments: Has converted part of her home into a carehome so has access to most equipment she might need but does not use.      Prior Functioning/Environment Prior Level of Function : Independent/Modified Independent;Driving;Working/employed             Mobility Comments: Active, independent, no falls in last year. ADLs Comments: Working, independent with ADL/IADL management. Has doen well since her mastectomy.        OT Problem List: Increased edema;Decreased safety awareness;Decreased knowledge of use of DME or AE;Impaired UE functional use      OT Treatment/Interventions:      OT Goals(Current goals can be found in the care plan section) Acute Rehab OT Goals Patient Stated Goal: To go home OT Goal Formulation: All assessment and education complete, DC therapy Time For Goal Achievement: 01/21/23 Potential to Achieve Goals: Good  OT Frequency:      Co-evaluation              AM-PAC OT "6 Clicks" Daily Activity     Outcome Measure Help from another person eating meals?: None Help from another person taking care of personal grooming?: None Help from another person toileting, which includes using toliet, bedpan, or urinal?: None Help from another person bathing (including washing,  rinsing, drying)?: None Help from another person to put on and taking off regular upper body clothing?: None Help from another person to put on and taking off regular lower body clothing?: None 6 Click Score: 24   End of Session Nurse Communication: Other (comment);Precautions  Activity Tolerance: Patient tolerated treatment well Patient left: in bed;with call bell/phone within reach  OT Visit Diagnosis: Other abnormalities of gait and mobility (R26.89)                Time: 1610-9604 OT Time Calculation (min): 28 min Charges:  OT  General Charges $OT Visit: 1 Visit OT Evaluation $OT Eval Low Complexity: 1 Low OT Treatments $Self Care/Home Management : 8-22 mins  Rockney Ghee, M.S., OTR/L 01/21/23, 3:40 PM

## 2023-01-21 NOTE — Progress Notes (Signed)
Subjective:  CC: Tina Payne is a 57 y.o. female  Hospital stay day 3,   infected mastectomy site s/p expander placement  HPI: No acute issues overnight.  Overall pain reported to be improving.  ROS:  General: Denies weight loss, weight gain, fatigue, fevers, chills, and night sweats. Heart: Denies chest pain, palpitations, racing heart, irregular heartbeat, leg pain or swelling, and decreased activity tolerance. Respiratory: Denies breathing difficulty, shortness of breath, wheezing, cough, and sputum. GI: Denies change in appetite, heartburn, nausea, vomiting, constipation, diarrhea, and blood in stool. GU: Denies difficulty urinating, pain with urinating, urgency, frequency, blood in urine.   Objective:   Temp:  [97.9 F (36.6 C)-99.4 F (37.4 C)] 98.9 F (37.2 C) (06/05 0525) Pulse Rate:  [69-73] 69 (06/05 0525) Resp:  [16-20] 16 (06/05 0525) BP: (113-131)/(63-86) 113/63 (06/05 0525) SpO2:  [99 %] 99 % (06/04 1718)     Height: 5\' 8"  (172.7 cm) Weight: 84.5 kg BMI (Calculated): 28.33   Intake/Output this shift:   Intake/Output Summary (Last 24 hours) at 01/21/2023 0757 Last data filed at 01/21/2023 0606 Gross per 24 hour  Intake 880 ml  Output 69 ml  Net 811 ml    Constitutional :  alert, cooperative, appears stated age, and no distress  Respiratory:  clear to auscultation bilaterally  Cardiovascular:  regular rate and rhythm  Gastrointestinal: soft, non-tender; bowel sounds normal; no masses,  no organomegaly.   Skin: Cool and moist. Chaperone present for exam.  Right mastectomy site with erythema and TTP improving, but more swelling present this am. Obvious purulent drainage in JP drain, still present.   Psychiatric: Normal affect, non-agitated, not confused       LABS:     Latest Ref Rng & Units 01/21/2023    4:06 AM 01/20/2023    5:04 AM 01/19/2023    8:50 AM  CMP  Glucose 70 - 99 mg/dL 161  096    BUN 6 - 20 mg/dL 13  12    Creatinine 0.45 - 1.00 mg/dL 4.09   8.11  9.14   Sodium 135 - 145 mmol/L 138  137    Potassium 3.5 - 5.1 mmol/L 3.9  3.3    Chloride 98 - 111 mmol/L 105  102    CO2 22 - 32 mmol/L 25  24    Calcium 8.9 - 10.3 mg/dL 9.2  9.1        Latest Ref Rng & Units 01/21/2023    4:06 AM 01/20/2023    5:04 AM 01/19/2023    8:52 AM  CBC  WBC 4.0 - 10.5 K/uL 11.4  10.8  13.2   Hemoglobin 12.0 - 15.0 g/dL 78.2  95.6  21.3   Hematocrit 36.0 - 46.0 % 34.4  35.3  34.5   Platelets 150 - 400 K/uL 387  366  316     RADS: N/a Assessment:   S/p infected mastectomy site s/p expander placement.  Area continues to drain moderate amount of purulence, wbc increased today.  Plastics still recommends continuing present management as long as overlying skin has no significant change in appearance.   Appreciate hospitalist recs re: glucose control.  Continuing IV ancef for MSSA culture.  Consider repeat imaging if wbc continues to trend upward again. Drain still seems to be draining well.  labs/images/medications/previous chart entries reviewed personally and relevant changes/updates noted above.

## 2023-01-21 NOTE — Plan of Care (Signed)
  RD consulted for nutrition education regarding diabetes.   Lab Results  Component Value Date   HGBA1C 9.9 (H) 01/18/2023   Spoke with pt at bedside, who was pleasant and in good spirits at time of visit. Pt shares that she has had DM for 3-5 years and admits to not managing as well as she should. Pt does not take her blood sugar daily and states "I've been eating whatever I want; I know I shouldn't have done that". Pt consumes low calorie beverages, but admits to eating things like ice cream and honey wheat bread. Pt started making changes pre-operatively when plastics told her to reduce carbs in her diet; she started making fish and snacking on avocados at home and is willing to make more changes. RD reviewed importance of DM self-management and how acute illness/ infection/ surgery can impact blood sugar levels secondary to acute stress response. Discussed how DM is managed inpatient vs outpatient. Pt has received a freestyle libre from DM coordinator and is excited to use. Pt had thoughtful questions and already has ideas for meals and snacks at home, such as switching to whole wheat bread and snacking on apples and peanut butter.   RD provided "Carbohydrate Counting for People with Diabetes" and "Plate Method" handouts from the Academy of Nutrition and Dietetics. Discussed different food groups and their effects on blood sugar, emphasizing carbohydrate-containing foods. Provided list of carbohydrates and recommended serving sizes of common foods.  Discussed importance of controlled and consistent carbohydrate intake throughout the day. Provided examples of ways to balance meals/snacks and encouraged intake of high-fiber, whole grain complex carbohydrates. Teach back method used.  Expect fair compliance.  Current diet order is carb modified, patient is consuming approximately 100% of meals at this time. Labs and medications reviewed. No further nutrition interventions warranted at this time. RD  contact information provided. If additional nutrition issues arise, please re-consult RD.  Levada Schilling, RD, LDN, CDCES Registered Dietitian II Certified Diabetes Care and Education Specialist Please refer to [facility] for RD and/or RD on-call/weekend/after hours pager

## 2023-01-21 NOTE — Telephone Encounter (Signed)
Patient Advocate Encounter   Received notification  that prior authorization for FreeStyle Libre 3 Sensor is required.   PA submitted on 01/21/2023 Key [id number] Insurance [facility] Medicaid of [facility] Electronic Prior Authorization Request Form Status is pending       Roland Earl, CPhT Pharmacy Patient Advocate Specialist [facility] Patient Advocate Team Direct Number: [phone]  Fax: [phone]

## 2023-01-21 NOTE — Inpatient Diabetes Management (Addendum)
Inpatient Diabetes Program Recommendations  AACE/ADA: New Consensus Statement on Inpatient Glycemic Control (2015)  Target Ranges:  Prepandial:   less than 140 mg/dL      Peak postprandial:   less than 180 mg/dL (1-2 hours)      Critically ill patients:  140 - 180 mg/dL    Latest Reference Range & Units 01/20/23 08:28 01/20/23 12:06 01/20/23 17:14 01/20/23 21:41  Glucose-Capillary 70 - 99 mg/dL 161 (H)  5 units Novolog  311 (H)  10 units Novolog  125 (H)  4 units Novolog  127 (H)    Refused Semglee  (H): Data is abnormally high  Latest Reference Range & Units 01/21/23 09:08  Glucose-Capillary 70 - 99 mg/dL 096 (H)  (H): Data is abnormally high   Home DM Meds: Metformin 500 mg daily   Current Orders: Semglee 12 units QHS                           Novolog Sensitive Correction Scale/ SSI (0-9 units) TID AC + HS                           Novolog 3 units TID with meals     MD- Note that pt Refused the Semglee 12 units QHS last PM.  CBG is 167 this AM--Should we d/c the Semglee for now??   May want to reduce the dose to 6 units QHS    If pt discharged home on Basal Insulin + SSI, please note that Lantus and Novolog are both covered by her insurance and are $4 co-pay  Lantus Insulin Pen- Order # [id number] Novolog Insulin Pen- Order # [id number] Insulin Pen Needles- Order # [id number]    Addendum 10:30am--Met w/ pt again at bedside today to discuss pt's desire to use CGM at home.  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  Pt had some questions about CGM that I was able to answer.  Discussed with pt that CGM may have a 15-20 minute lag behind a traditional fingerstick CBG and that pt should always check a fingerstick CBG when her symptoms don't match her CGM reading.  Assisted pt to download the FSL app to her phone.  Dr. Sherryll Burger gave me permission to give pt 2 FSL 3 samples--Did not apply the CGM to  pt's arm yet as she will remain in the hospital today--Pt told me she knows how to use the FSL already and feels comfortable applying at home.  I also reminded pt about the written instructions in the box and on-line educational videos as well.  Have asked OP Pharmacy to being PTA for the FSL3.  Also attached insulin pen Video QR code to pt's d/c paperwork today.      --Will follow patient during hospitalization--  Ambrose Finland RN, MSN, CDCES Diabetes Coordinator Inpatient Glycemic Control Team Team Pager: [phone] (8a-5p)

## 2023-01-21 NOTE — Discharge Instructions (Addendum)
Plate Method for Diabetes   Foods with carbohydrates make your blood glucose level go up. The plate method is a simple way to meal plan and control the amount of carbohydrate you eat.         Use the following guidance to build a healthy plate to control carbohydrates. Divide a 9-inch plate into 3 sections, and consider your beverage the 4th section of your meal: Food Group Examples of Foods/Beverages for This Section of your Meal  Section 1: Non-starchy vegetables Fill  of your plate to include non-starchy vegetables Asparagus, broccoli, brussels sprouts, cabbage, carrots, cauliflower, celery, cucumber, green beans, mushrooms, peppers, salad greens, tomatoes, or zucchini.  Section 2: Protein foods Fill  of your plate to include a lean protein Lean meat, poultry, fish, seafood, cheese, eggs, lean deli meat, tofu, beans, lentils, nuts or nut butters.  Section 3: Carbohydrate foods Fill  of your plate to include carbohydrate foods Whole grains, whole wheat bread, brown rice, whole grain pasta, polenta, corn tortillas, fruit, or starchy vegetables (potatoes, green peas, corn, beans, acorn squash, and butternut squash). One cup of milk also counts as a food that contains carbohydrate.  Section 4: Beverage Choose water or a low-calorie drink for your beverage. Unsweetened tea, coffee, or flavored/sparkling water without added sugar.  Image reprinted with permission from The American Diabetes Association.  Copyright 2022 by the American Diabetes Association.   Copyright 2022  Academy of Nutrition and Dietetics. All rights reserved    Carbohydrate Counting For People With Diabetes  Foods with carbohydrates make your blood glucose level go up. Learning how to count carbohydrates can help you control your blood glucose levels. First, identify the foods you eat that contain carbohydrates. Then, using the Foods with Carbohydrates chart, determine about how much carbohydrates are in your meals and  snacks. Make sure you are eating foods with fiber, protein, and healthy fat along with your carbohydrate foods. Foods with Carbohydrates The following table shows carbohydrate foods that have about 15 grams of carbohydrate each. Using measuring cups, spoons, or a food scale when you first begin learning about carbohydrate counting can help you learn about the portion sizes you typically eat. The following foods have 15 grams carbohydrate each:  Grains 1 slice bread (1 ounce)  1 small tortilla (6-inch size)   large bagel (1 ounce)  1/3 cup pasta or rice (cooked)   hamburger or hot dog bun ( ounce)   cup cooked cereal   to  cup ready-to-eat cereal  2 taco shells (5-inch size) Fruit 1 small fresh fruit ( to 1 cup)   medium banana  17 small grapes (3 ounces)  1 cup melon or berries   cup canned or frozen fruit  2 tablespoons dried fruit (blueberries, cherries, cranberries, raisins)   cup unsweetened fruit juice  Starchy Vegetables  cup cooked beans, peas, corn, potatoes/sweet potatoes   large baked potato (3 ounces)  1 cup acorn or butternut squash  Snack Foods 3 to 6 crackers  8 potato chips or 13 tortilla chips ( ounce to 1 ounce)  3 cups popped popcorn  Dairy 3/4 cup (6 ounces) nonfat plain yogurt, or yogurt with sugar-free sweetener  1 cup milk  1 cup plain rice, soy, coconut or flavored almond milk Sweets and Desserts  cup ice cream or frozen yogurt  1 tablespoon jam, jelly, pancake syrup, table sugar, or honey  2 tablespoons light pancake syrup  1 inch square of frosted cake or 2 inch square of   unfrosted cake  2 small cookies (2/3 ounce each) or  large cookie  Sometimes you'll have to estimate carbohydrate amounts if you don't know the exact recipe. One cup of mixed foods like soups can have 1 to 2 carbohydrate servings, while some casseroles might have 2 or more servings of carbohydrate. Foods that have less than 20 calories in each serving can be counted as  "free" foods. Count 1 cup raw vegetables, or  cup cooked non-starchy vegetables as "free" foods. If you eat 3 or more servings at one meal, then count them as 1 carbohydrate serving.  Foods without Carbohydrates  Not all foods contain carbohydrates. Meat, some dairy, fats, non-starchy vegetables, and many beverages don't contain carbohydrate. So when you count carbohydrates, you can generally exclude chicken, pork, beef, fish, seafood, eggs, tofu, cheese, butter, sour cream, avocado, nuts, seeds, olives, mayonnaise, water, black coffee, unsweetened tea, and zero-calorie drinks. Vegetables with no or low carbohydrate include green beans, cauliflower, tomatoes, and onions. How much carbohydrate should I eat at each meal?  Carbohydrate counting can help you plan your meals and manage your weight. Following are some starting points for carbohydrate intake at each meal. Work with your registered dietitian nutritionist to find the best range that works for your blood glucose and weight.   To Lose Weight To Maintain Weight  Women 2 - 3 carb servings 3 - 4 carb servings  Men 3 - 4 carb servings 4 - 5 carb servings  Checking your blood glucose after meals will help you know if you need to adjust the timing, type, or number of carbohydrate servings in your meal plan. Achieve and keep a healthy body weight by balancing your food intake and physical activity.  Tips How should I plan my meals?  Plan for half the food on your plate to include non-starchy vegetables, like salad greens, broccoli, or carrots. Try to eat 3 to 5 servings of non-starchy vegetables every day. Have a protein food at each meal. Protein foods include chicken, fish, meat, eggs, or beans (note that beans contain carbohydrate). These two food groups (non-starchy vegetables and proteins) are low in carbohydrate. If you fill up your plate with these foods, you will eat less carbohydrate but still fill up your stomach. Try to limit your carbohydrate  portion to  of the plate.  What fats are healthiest to eat?  Diabetes increases risk for heart disease. To help protect your heart, eat more healthy fats, such as olive oil, nuts, and avocado. Eat less saturated fats like butter, cream, and high-fat meats, like bacon and sausage. Avoid trans fats, which are in all foods that list "partially hydrogenated oil" as an ingredient. What should I drink?  Choose drinks that are not sweetened with sugar. The healthiest choices are water, carbonated or seltzer waters, and tea and coffee without added sugars.  Sweet drinks will make your blood glucose go up very quickly. One serving of soda or energy drink is  cup. It is best to drink these beverages only if your blood glucose is low.  Artificially sweetened, or diet drinks, typically do not increase your blood glucose if they have zero calories in them. Read labels of beverages, as some diet drinks do have carbohydrate and will raise your blood glucose. Label Reading Tips Read Nutrition Facts labels to find out how many grams of carbohydrate are in a food you want to eat. Don't forget: sometimes serving sizes on the label aren't the same as how much food  you are going to eat, so you may need to calculate how much carbohydrate is in the food you are serving yourself.   Carbohydrate Counting for People with Diabetes Sample 1-Day Menu  Breakfast  cup yogurt, low fat, low sugar (1 carbohydrate serving)   cup cereal, ready-to-eat, unsweetened (1 carbohydrate serving)  1 cup strawberries (1 carbohydrate serving)   cup almonds ( carbohydrate serving)  Lunch 1, 5 ounce can chunk light tuna  2 ounces cheese, low fat cheddar  6 whole wheat crackers (1 carbohydrate serving)  1 small apple (1 carbohydrate servings)   cup carrots ( carbohydrate serving)   cup snap peas  1 cup 1% milk (1 carbohydrate serving)   Evening Meal Stir fry made with: 3 ounces chicken  1 cup brown rice (3 carbohydrate servings)    cup broccoli ( carbohydrate serving)   cup green beans   cup onions  1 tablespoon olive oil  2 tablespoons teriyaki sauce ( carbohydrate serving)  Evening Snack 1 extra small banana (1 carbohydrate serving)  1 tablespoon peanut butter   Carbohydrate Counting for People with Diabetes Vegan Sample 1-Day Menu  Breakfast 1 cup cooked oatmeal (2 carbohydrate servings)   cup blueberries (1 carbohydrate serving)  2 tablespoons flaxseeds  1 cup soymilk fortified with calcium and vitamin D  1 cup coffee  Lunch 2 slices whole wheat bread (2 carbohydrate servings)   cup baked tofu   cup lettuce  2 slices tomato  2 slices avocado   cup baby carrots ( carbohydrate serving)  1 orange (1 carbohydrate serving)  1 cup soymilk fortified with calcium and vitamin D   Evening Meal Burrito made with: 1 6-inch corn tortilla (1 carbohydrate serving)  1 cup refried vegetarian beans (2 carbohydrate servings)   cup chopped tomatoes   cup lettuce   cup salsa  1/3 cup brown rice (1 carbohydrate serving)  1 tablespoon olive oil for rice   cup zucchini   Evening Snack 6 small whole grain crackers (1 carbohydrate serving)  2 apricots ( carbohydrate serving)   cup unsalted peanuts ( carbohydrate serving)    Carbohydrate Counting for People with Diabetes Vegetarian (Lacto-Ovo) Sample 1-Day Menu  Breakfast 1 cup cooked oatmeal (2 carbohydrate servings)   cup blueberries (1 carbohydrate serving)  2 tablespoons flaxseeds  1 egg  1 cup 1% milk (1 carbohydrate serving)  1 cup coffee  Lunch 2 slices whole wheat bread (2 carbohydrate servings)  2 ounces low-fat cheese   cup lettuce  2 slices tomato  2 slices avocado   cup baby carrots ( carbohydrate serving)  1 orange (1 carbohydrate serving)  1 cup unsweetened tea  Evening Meal Burrito made with: 1 6-inch corn tortilla (1 carbohydrate serving)   cup refried vegetarian beans (1 carbohydrate serving)   cup tomatoes   cup lettuce    cup salsa  1/3 cup brown rice (1 carbohydrate serving)  1 tablespoon olive oil for rice   cup zucchini  1 cup 1% milk (1 carbohydrate serving)  Evening Snack 6 small whole grain crackers (1 carbohydrate serving)  2 apricots ( carbohydrate serving)   cup unsalted peanuts ( carbohydrate serving)    Copyright 2020  Academy of Nutrition and Dietetics. All rights reserved.  Using Nutrition Labels: Carbohydrate  Serving Size  Look at the serving size. All the information on the label is based on this portion. Servings Per Container  The number of servings contained in the package. Guidelines for Carbohydrate  Look at   the total grams of carbohydrate in the serving size.  1 carbohydrate choice = 15 grams of carbohydrate. Range of Carbohydrate Grams Per Choice  Carbohydrate Grams/Choice Carbohydrate Choices  6-10   11-20 1  21-25 1  26-35 2  36-40 2  41-50 3  51-55 3  56-65 4  66-70 4  71-80 5    Copyright 2020  Academy of Nutrition and Dietetics. All rights reserved.     INSTRUCTIONS FOR AFTER BREAST SURGERY   You will likely have some questions about what to expect following your operation.  The following information will help you and your family understand what to expect when you are discharged from the hospital.  It is important to follow these guidelines to help ensure a smooth recovery and reduce complication.  Postoperative instructions include information on: diet, wound care, medications and physical activity.  AFTER SURGERY Expect to go home after the procedure.  In some cases, you may need to spend one night in the hospital for observation.  DIET Breast surgery does not require a specific diet.  However, the healthier you eat the better your body will heal. It is important to increasing your protein intake.  This means limiting the foods with sugar and carbohydrates.  Focus on vegetables and some meat.  If you have liposuction during your procedure be sure  to drink water.  If your urine is bright yellow, then it is concentrated, and you need to drink more water.  As a general rule after surgery, you should have 8 ounces of water every hour while awake.  If you find you are persistently nauseated or unable to take in liquids let us know.  NO TOBACCO USE or EXPOSURE.  This will slow your healing process and lead to a wound.  WOUND CARE Leave the binder on for 3 days . Use fragrance free soap like Dial, Dove or [place].   After 3 days you can remove the binder to shower. Once dry apply binder or sports bra. If you have liposuction you will have a soft and spongy dressing (Lipofoam) that helps prevent creases in your skin.  Remove before you shower and then replace it.  It is also available on [employer]. If you have steri-strips / tape directly attached to your skin leave them in place. It is OK to get these wet.   No baths, pools or hot tubs for four weeks. We close your incision to leave the smallest and best-looking scar. No ointment or creams on your incisions for four weeks.  No Neosporin (Too many skin reactions).  A few weeks after surgery you can use Mederma and start massaging the scar. We ask you to wear your binder or sports bra for the first 6 weeks around the clock, including while sleeping. This provides added comfort and helps reduce the fluid accumulation at the surgery site. NO Ice or heating pads to the operative site.  You have a very high risk of a BURN before you feel the temperature change.  ACTIVITY No heavy lifting until cleared by the doctor.  This usually means no more than a half-gallon of milk.  It is OK to walk and climb stairs. Moving your legs is very important to decrease your risk of a blood clot.  It will also help keep you from getting deconditioned.  Every 1 to 2 hours get up and walk for 5 minutes. This will help with a quicker recovery back to normal.  Let pain be your guide  so you don't do too much.  This time is for you to  recover.  You will be more comfortable if you sleep and rest with your head elevated either with a few pillows under you or in a recliner.  No stomach sleeping for a three months.  WORK Everyone returns to work at different times. As a rough guide, most people take at least 1 - 2 weeks off prior to returning to work. If you need documentation for your job, give the forms to the front staff at the clinic.  DRIVING Arrange for someone to bring you home from the hospital after your surgery.  You may be able to drive a few days after surgery but not while taking any narcotics or valium.  BOWEL MOVEMENTS Constipation can occur after anesthesia and while taking pain medication.  It is important to stay ahead for your comfort.  We recommend taking Milk of Magnesia (2 tablespoons; twice a day) while taking the pain pills.  MEDICATIONS You may be prescribed should start after surgery At your preoperative visit for you history and physical you may have been given the following medications: An antibiotic: Start this medication when you get home and take according to the instructions on the bottle. Zofran 4 mg:  This is to treat nausea and vomiting.  You can take this every 6 hours as needed and only if needed. Valium 2 mg for breast cancer patients: This is for muscle tightness if you have an implant or expander. This will help relax your muscle which also helps with pain control.  This can be taken every 12 hours as needed. Don't drive after taking this medication. Norco (hydrocodone/acetaminophen) 5/325 mg:  This is only to be used after you have taken the Motrin or the Tylenol. Every 8 hours as needed.   Over the counter Medication to take: Ibuprofen (Motrin) 600 mg:  Take this every 6 hours.  If you have additional pain then take 500 mg of the Tylenol every 8 hours.  Only take the Norco after you have tried these two. MiraLAX or Milk of Magnesia: Take this according to the bottle if you take the  Norco.  WHEN TO CALL Call your surgeon's office if any of the following occur: Fever 101 degrees F or greater Excessive bleeding or fluid from the incision site. Pain that increases over time without aid from the medications Redness, warmth, or pus draining from incision sites Persistent nausea or inability to take in liquids Severe misshapen area that underwent the operation.

## 2023-01-21 NOTE — Telephone Encounter (Signed)
Patient Advocate Encounter  Prior Authorization for [name]  has been approved.    PA# [id number] Insurance [facility] Medicaid of [facility] Electronic Prior Authorization Request Form  Effective dates: 01/21/2023 through 07/20/2023  Patients co-pay is $0.00.     Tina Payne, CPhT Pharmacy Patient Advocate Specialist [facility] Patient Advocate Team Direct Number: [phone]  Fax: [phone]

## 2023-01-21 NOTE — Progress Notes (Signed)
  Progress Note   Patient: Tina Payne UEA:[id number] DOB: 09-23-1965 DOA: 01/17/2023     3 DOS: the patient was seen and examined on 01/21/2023   Brief hospital course: 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 She is admitted by general surgery to manage for postoperative infection.  We are consulted for managing diabetes. Hb A1C 9.9  Assessment and Plan: * Post-operative infection Per primary team/surgery  Hyperglycemia due to type 2 diabetes mellitus (HCC) Patient states she does not follow diabetic diet Complicated by postop infection in setting of mastectomy on 01/01/2023 Control infection per antibiotic and primary team Continue insulin sliding scale, insulin NovoLog 3 units subcu 3 times daily with each meals.  Continue insulin Semglee 12 units subcu daily Home metformin 500 mg with breakfast not resumed on admission Goal inpatient blood glucose levels 140-180  Overweight. Diet and excise.  Hypothyroidism, acquired, autoimmune Levothyroxine 75 mcg daily before breakfast resumed        Subjective: Complains of minimal pain  Physical Exam: Vitals:   01/20/23 1718 01/20/23 2049 01/21/23 0525 01/21/23 1544  BP: 123/86 131/76 113/63 126/76  Pulse: 73 70 69 71  Resp: 18 20 16 17   Temp: 97.9 F (36.6 C) 99.4 F (37.4 C) 98.9 F (37.2 C) 98.3 F (36.8 C)  TempSrc:  Oral Oral   SpO2: 99%   99%  Weight:      Height:       General exam: Appears calm and comfortable  Respiratory system: Clear to auscultation. Respiratory effort normal. Cardiovascular system: S1 & S2 heard, RRR. No JVD, murmurs, rubs, gallops or clicks. No pedal edema. Gastrointestinal system: Abdomen is nondistended, soft  and nontender. No organomegaly or masses felt. Normal bowel sounds heard. Central nervous system: Alert and oriented. No focal neurological deficits. Extremities: Symmetric 5 x 5 power. Skin: JP drain draining purulent drainage Psychiatry: Judgement and insight appear normal. Mood & affect appropriate.  Data Reviewed:  WBC 11.4  Family Communication: None at bedside    DVT prophylaxis-Lovenox Time spent: 15 minutes  Author: Delfino Lovett, MD 01/21/2023 4:40 PM  For on call review [email or web address].

## 2023-01-22 ENCOUNTER — Inpatient Hospital Stay: Payer: 59

## 2023-01-22 DIAGNOSIS — E063 Autoimmune thyroiditis: Secondary | ICD-10-CM | POA: Diagnosis not present

## 2023-01-22 DIAGNOSIS — N61 Mastitis without abscess: Secondary | ICD-10-CM | POA: Diagnosis not present

## 2023-01-22 DIAGNOSIS — T8140XA Infection following a procedure, unspecified, initial encounter: Secondary | ICD-10-CM | POA: Diagnosis not present

## 2023-01-22 DIAGNOSIS — E663 Overweight: Secondary | ICD-10-CM | POA: Diagnosis not present

## 2023-01-22 DIAGNOSIS — T8579XA Infection and inflammatory reaction due to other internal prosthetic devices, implants and grafts, initial encounter: Secondary | ICD-10-CM

## 2023-01-22 DIAGNOSIS — E1165 Type 2 diabetes mellitus with hyperglycemia: Secondary | ICD-10-CM | POA: Diagnosis not present

## 2023-01-22 LAB — CBC
HCT: 36.5 % (ref 36.0–46.0)
Hemoglobin: 11.2 g/dL — ABNORMAL LOW (ref 12.0–15.0)
MCH: 24.6 pg — ABNORMAL LOW (ref 26.0–34.0)
MCHC: 30.7 g/dL (ref 30.0–36.0)
MCV: 80 fL (ref 80.0–100.0)
Platelets: 429 10*3/uL — ABNORMAL HIGH (ref 150–400)
RBC: 4.56 MIL/uL (ref 3.87–5.11)
RDW: 14 % (ref 11.5–15.5)
WBC: 15 10*3/uL — ABNORMAL HIGH (ref 4.0–10.5)
nRBC: 0.1 % (ref 0.0–0.2)

## 2023-01-22 LAB — BASIC METABOLIC PANEL
Anion gap: 12 (ref 5–15)
BUN: 14 mg/dL (ref 6–20)
CO2: 24 mmol/L (ref 22–32)
Calcium: 9.8 mg/dL (ref 8.9–10.3)
Chloride: 103 mmol/L (ref 98–111)
Creatinine, Ser: 0.71 mg/dL (ref 0.44–1.00)
GFR, Estimated: >60 mL/min (ref >60)
Glucose, Bld: 161 mg/dL — ABNORMAL HIGH (ref 70–99)
Potassium: 4 mmol/L (ref 3.5–5.1)
Sodium: 139 mmol/L (ref 135–145)

## 2023-01-22 LAB — GLUCOSE, CAPILLARY
Glucose-Capillary: 187 mg/dL — ABNORMAL HIGH (ref 70–99)
Glucose-Capillary: 212 mg/dL — ABNORMAL HIGH (ref 70–99)
Glucose-Capillary: 159 mg/dL — ABNORMAL HIGH (ref 70–99)
Glucose-Capillary: 111 mg/dL — ABNORMAL HIGH (ref 70–99)

## 2023-01-22 LAB — BODY FLUID CULTURE W GRAM STAIN

## 2023-01-22 LAB — CULTURE, BLOOD (ROUTINE X 2): Culture: NO GROWTH

## 2023-01-22 MED ORDER — IOHEXOL 300 MG/ML  SOLN
75.0000 mL | Freq: Once | INTRAMUSCULAR | Status: AC | PRN
  Administered 2023-01-22: 75 mL via INTRAVENOUS

## 2023-01-22 MED ORDER — SODIUM CHLORIDE 0.9 % IV SOLN
2.0000 g | Freq: Three times a day (TID) | INTRAVENOUS | Status: DC
  Administered 2023-01-22 – 2023-01-23 (×3): 2 g via INTRAVENOUS
  Filled 2023-01-22 (×3): qty 12.5

## 2023-01-22 MED ORDER — BISACODYL 10 MG RE SUPP: 10.0000 mg | Freq: Once | RECTAL | Status: DC

## 2023-01-22 MED ORDER — CHLORHEXIDINE GLUCONATE CLOTH 2 % EX PADS
6.0000 | MEDICATED_PAD | Freq: Once | CUTANEOUS | Status: AC
  Administered 2023-01-22: 6 via TOPICAL

## 2023-01-22 MED ORDER — SENNOSIDES-DOCUSATE SODIUM 8.6-50 MG PO TABS
2.0000 | ORAL_TABLET | Freq: Two times a day (BID) | ORAL | Status: DC
  Administered 2023-01-23 – 2023-01-24 (×3): 2 via ORAL
  Administered 2023-01-25: 1 via ORAL
  Filled 2023-01-22 (×7): qty 2

## 2023-01-22 MED ORDER — SODIUM CHLORIDE 0.9 % IV SOLN: INTRAVENOUS | Status: DC

## 2023-01-22 MED ORDER — POLYETHYLENE GLYCOL 3350 17 G PO PACK
17.0000 g | PACK | Freq: Every day | ORAL | Status: DC
  Administered 2023-01-22 – 2023-01-25 (×4): 17 g via ORAL
  Filled 2023-01-22 (×5): qty 1

## 2023-01-22 NOTE — Progress Notes (Signed)
Pharmacy Antibiotic Note  Tina Payne is a 57 y.o. female w/ PMH of hypothyroidism,  invasive ductal carcinoma on the right s/p mastectomy on 01/01/2023, anxiety, chronic anemia, obesity admitted on 01/17/2023 with cellulitis.  Pharmacy has been consulted for cefepime dosing.  Plan:  start cefepime 2 grams IV every 8 hours ----monitor renal function for needed dose adjustments  Height: 5\' 8"  (172.7 cm) Weight: 84.5 kg (186 lb 4.6 oz) IBW/kg (Calculated) : 63.9  Temp (24hrs), Avg:98.5 F (36.9 C), Min:98.1 F (36.7 C), Max:98.7 F (37.1 C)  Recent Labs  Lab 01/18/23 0010 01/19/23 0850 01/19/23 0852 01/20/23 0504 01/21/23 0406 01/22/23 0518  WBC 19.4*  --  13.2* 10.8* 11.4* 15.0*  CREATININE 0.93 0.77  --  0.76 0.65 0.71  LATICACIDVEN 1.8  --   --   --   --   --     Estimated Creatinine Clearance: 87.2 mL/min (by C-G formula based on SCr of 0.71 mg/dL).    No Known Allergies  Antimicrobials this admission: 06/02 vancomycin >> 06/04 06/02 metronidazole >> 06/04 06/04 cefazolin >> 06/06 06/02 cefepime >> 06/04  06/06 >>  Microbiology results: 06/02 BCx: NG x 4 days 06/02 UCx: insignificant growth  06/02 WCx: P fluorescens, S aureus   Thank you for allowing pharmacy to be a part of this patient's care.  Lowella Bandy 01/22/2023 7:11 PM

## 2023-01-22 NOTE — Progress Notes (Signed)
Subjective:  CC: Tina Payne is a 57 y.o. female  Hospital stay day 4,   infected mastectomy site s/p expander placement  HPI: No acute issues or complaints overnight.     ROS:  General: Denies weight loss, weight gain, fatigue, fevers, chills, and night sweats. Heart: Denies chest pain, palpitations, racing heart, irregular heartbeat, leg pain or swelling, and decreased activity tolerance. Respiratory: Denies breathing difficulty, shortness of breath, wheezing, cough, and sputum. GI: Denies change in appetite, heartburn, nausea, vomiting, constipation, diarrhea, and blood in stool. GU: Denies difficulty urinating, pain with urinating, urgency, frequency, blood in urine.   Objective:   Temp:  [98.1 F (36.7 C)-98.7 F (37.1 C)] 98.1 F (36.7 C) (06/06 0927) Pulse Rate:  [64-76] 76 (06/06 0927) Resp:  [16-18] 18 (06/06 0927) BP: (113-126)/(62-76) 113/75 (06/06 0927) SpO2:  [98 %-100 %] 98 % (06/06 0927)     Height: 5\' 8"  (172.7 cm) Weight: 84.5 kg BMI (Calculated): 28.33   Intake/Output this shift:   Intake/Output Summary (Last 24 hours) at 01/22/2023 1433 Last data filed at 01/22/2023 1027 Gross per 24 hour  Intake --  Output 65 ml  Net -65 ml    Constitutional :  alert, cooperative, appears stated age, and no distress  Respiratory:  clear to auscultation bilaterally  Cardiovascular:  regular rate and rhythm  Gastrointestinal: soft, non-tender; bowel sounds normal; no masses,  no organomegaly.   Skin: Cool and moist. Chaperone present for exam.  Right mastectomy site with more swelling and erythema present this am.  Purulent drainage has improved. See pic  Psychiatric: Normal affect, non-agitated, not confused         LABS:     Latest Ref Rng & Units 01/22/2023    5:18 AM 01/21/2023    4:06 AM 01/20/2023    5:04 AM  CMP  Glucose 70 - 99 mg/dL 161  096  045   BUN 6 - 20 mg/dL 14  13  12    Creatinine 0.44 - 1.00 mg/dL 4.09  8.11  9.14   Sodium 135 - 145 mmol/L  139  138  137   Potassium 3.5 - 5.1 mmol/L 4.0  3.9  3.3   Chloride 98 - 111 mmol/L 103  105  102   CO2 22 - 32 mmol/L 24  25  24    Calcium 8.9 - 10.3 mg/dL 9.8  9.2  9.1       Latest Ref Rng & Units 01/22/2023    5:18 AM 01/21/2023    4:06 AM 01/20/2023    5:04 AM  CBC  WBC 4.0 - 10.5 K/uL 15.0  11.4  10.8   Hemoglobin 12.0 - 15.0 g/dL 78.2  95.6  21.3   Hematocrit 36.0 - 46.0 % 36.5  34.4  35.3   Platelets 150 - 400 K/uL 429  387  366     RADS: CLINICAL DATA:  Status post mastectomy with expander placement and subsequent infection.   EXAM: CT CHEST WITH CONTRAST   TECHNIQUE: Multidetector CT imaging of the chest was performed during intravenous contrast administration.   RADIATION DOSE REDUCTION: This exam was performed according to the departmental dose-optimization program which includes automated exposure control, adjustment of the mA and/or kV according to patient size and/or use of iterative reconstruction technique.   CONTRAST:  75mL OMNIPAQUE IOHEXOL 300 MG/ML  SOLN   COMPARISON:  01/18/2023   FINDINGS: Cardiovascular: The heart size is normal. No substantial pericardial effusion. Mild atherosclerotic calcification is noted in  the wall of the thoracic aorta.   Mediastinum/Nodes: No mediastinal lymphadenopathy. There is no hilar lymphadenopathy. The esophagus has normal imaging features. 9 mm short axis right axillary node measured previously is 11 mm short axis today on image 46/2.   Lungs/Pleura: Centrilobular emphsyema noted. Subsegmental atelectasis or scarring noted right lower lobe, stable. No new suspicious pulmonary nodule or mass. No focal airspace consolidation. No pleural effusion.   Upper Abdomen: Visualized portion of the upper abdomen is unremarkable.   Musculoskeletal: There is persistent soft tissue in fluid density around the right anterior chest wall implant with a cluster of small gas bubble seen along the posteromedial region of this  collection. Overall, the volume of gas associated with both the expander and the surrounding soft tissues has decreased in the interval. Similar appearance of skin thickening in the right breast. Surgical drain remains in place laterally. No worrisome lytic or sclerotic osseous abnormality.   IMPRESSION: 1. Persistent soft tissue and fluid density around the right anterior chest wall implant without evidence for interval progression. There is a cluster of small gas bubbles seen along the posteromedial region of this collection. Overall, the volume of gas associated with both the expander and the surrounding soft tissues has decreased in the interval. Imaging features remain compatible with reported clinical history of infection. 2. Slight interval increase in size of a right axillary lymph node, likely reactive. 3. Aortic Atherosclerosis (ICD10-I70.0) and Emphysema (ICD10-J43.9).     Electronically Signed   By: Kennith Center M.D.   On: 01/22/2023 12:12   Assessment:   S/p infected mastectomy site s/p expander placement.  Drainage appearance is thin less purulent, but leukocytosis continued to worsen this a.m. so CT scan obtained as above.  Will discuss with plastics regarding further management.  Patient also noted to grow additional organisms from culture.  Will consult infectious disease for further antibiotic management.  Appreciate hospitalist recs re: glucose control.  labs/images/medications/previous chart entries reviewed personally and relevant changes/updates noted above.

## 2023-01-22 NOTE — TOC CM/SW Note (Signed)
Transition of Care [facility]) - Inpatient Brief Assessment   Patient Details  Name: Tina Payne MRN: [id number] Date of Birth: [birth year]  Transition of [facility]) CM/SW Contact:    Chapman Fitch, RN Phone Number: 01/22/2023, 6:16 PM   Clinical Narrative:    Transition of Care Asessment: Insurance and Status: Insurance coverage has been reviewed Patient has primary care physician: Yes     Prior/Current Home Services: No current home services Social Determinants of Health Reivew: SDOH reviewed no interventions necessary Readmission risk has been reviewed: Yes Transition of care needs: transition of care needs identified, TOC will continue to follow (ID consult pending)

## 2023-01-22 NOTE — Progress Notes (Signed)
  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 She is admitted by general surgery to manage for postoperative infection.  We are consulted for managing diabetes. Hb A1C 9.9  Assessment and Plan:  * Post-operative infection Per primary team/surgery Getting CT scan today per primary team due to worsening leukocytosis   Hyperglycemia due to type 2 diabetes mellitus (HCC) Patient states she does not follow diabetic diet Complicated by postop infection in setting of mastectomy on 01/01/2023 Control infection per antibiotic and primary team Continue insulin sliding scale, insulin NovoLog 3 units subcu 3 times daily with each meals.  Continue insulin Semglee 12 units subcu daily Home metformin 500 mg with breakfast not resumed on admission Goal inpatient blood glucose levels 140-180   Overweight. Diet and excise.   Hypothyroidism, acquired, autoimmune Levothyroxine 75 mcg daily before breakfast resumed          Subjective: No new issues, sitting in the chair.  Getting CT scan today per primary team  Physical Exam: Vitals:   01/21/23 2004 01/22/23 0636 01/22/23 0927 01/22/23 1700  BP: 118/76 120/62 113/75 120/84  Pulse: 72 64 76 76  Resp: 18 16 18 18   Temp: 98.7 F (37.1 C) 98.4 F (36.9 C) 98.1 F (36.7 C) 98.6 F (37 C)  TempSrc:   Oral Oral  SpO2: 98% 100% 98% 99%  Weight:      Height:       General exam: Appears calm and comfortable  Respiratory system: Clear to auscultation. Respiratory effort normal. Cardiovascular system: S1  & S2 heard, RRR. No JVD, murmurs, rubs, gallops or clicks. No pedal edema. Gastrointestinal system: Abdomen is nondistended, soft and nontender. No organomegaly or masses felt. Normal bowel sounds heard. Central nervous system: Alert and oriented. No focal neurological deficits. Extremities: Symmetric 5 x 5 power. Skin: JP drain draining purulent drainage Psychiatry: Judgement and insight appear normal. Mood & affect appropriate.  Data Reviewed:  There are no new results to review at this time.  Family Communication: None at bedside     DVT prophylaxis-Lovenox Time spent: 15 minutes  Author: Delfino Lovett, MD 01/22/2023 6:32 PM  For on call review [email or web address].
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
## 2023-01-23 ENCOUNTER — Encounter
Admission: EM | Disposition: A | Discharge status: Home / Self Care | Payer: Self-pay | Source: Home / Self Care | Attending: Surgery

## 2023-01-23 ENCOUNTER — Inpatient Hospital Stay: Payer: 59 | Admitting: Anesthesiology

## 2023-01-23 ENCOUNTER — Encounter: Payer: Self-pay | Admitting: Surgery

## 2023-01-23 ENCOUNTER — Encounter: Payer: Medicaid Other | Admitting: Plastic Surgery

## 2023-01-23 ENCOUNTER — Inpatient Hospital Stay: Payer: 59 | Admitting: Oncology

## 2023-01-23 DIAGNOSIS — N6489 Other specified disorders of breast: Secondary | ICD-10-CM | POA: Diagnosis not present

## 2023-01-23 DIAGNOSIS — E876 Hypokalemia: Secondary | ICD-10-CM | POA: Diagnosis not present

## 2023-01-23 DIAGNOSIS — L7634 Postprocedural seroma of skin and subcutaneous tissue following other procedure: Secondary | ICD-10-CM

## 2023-01-23 DIAGNOSIS — E663 Overweight: Secondary | ICD-10-CM | POA: Diagnosis not present

## 2023-01-23 DIAGNOSIS — Z853 Personal history of malignant neoplasm of breast: Secondary | ICD-10-CM

## 2023-01-23 DIAGNOSIS — T8140XA Infection following a procedure, unspecified, initial encounter: Secondary | ICD-10-CM | POA: Diagnosis not present

## 2023-01-23 HISTORY — PX: BREAST IMPLANT REMOVAL: SHX5361

## 2023-01-23 LAB — BODY FLUID CULTURE W GRAM STAIN

## 2023-01-23 LAB — CBC
HCT: 35 % — ABNORMAL LOW (ref 36.0–46.0)
Hemoglobin: 10.7 g/dL — ABNORMAL LOW (ref 12.0–15.0)
MCH: 24.5 pg — ABNORMAL LOW (ref 26.0–34.0)
MCHC: 30.6 g/dL (ref 30.0–36.0)
MCV: 80.1 fL (ref 80.0–100.0)
Platelets: 427 10*3/uL — ABNORMAL HIGH (ref 150–400)
RBC: 4.37 MIL/uL (ref 3.87–5.11)
RDW: 14.1 % (ref 11.5–15.5)
WBC: 12.4 10*3/uL — ABNORMAL HIGH (ref 4.0–10.5)
nRBC: 0 % (ref 0.0–0.2)

## 2023-01-23 LAB — BASIC METABOLIC PANEL
Anion gap: 9 (ref 5–15)
BUN: 13 mg/dL (ref 6–20)
CO2: 25 mmol/L (ref 22–32)
Calcium: 9.1 mg/dL (ref 8.9–10.3)
Chloride: 106 mmol/L (ref 98–111)
Creatinine, Ser: 0.65 mg/dL (ref 0.44–1.00)
GFR, Estimated: >60 mL/min (ref >60)
Glucose, Bld: 178 mg/dL — ABNORMAL HIGH (ref 70–99)
Potassium: 4 mmol/L (ref 3.5–5.1)
Sodium: 140 mmol/L (ref 135–145)

## 2023-01-23 LAB — CULTURE, BLOOD (ROUTINE X 2): Culture: NO GROWTH

## 2023-01-23 LAB — GLUCOSE, CAPILLARY
Glucose-Capillary: 176 mg/dL — ABNORMAL HIGH (ref 70–99)
Glucose-Capillary: 252 mg/dL — ABNORMAL HIGH (ref 70–99)
Glucose-Capillary: 316 mg/dL — ABNORMAL HIGH (ref 70–99)
Glucose-Capillary: 349 mg/dL — ABNORMAL HIGH (ref 70–99)

## 2023-01-23 LAB — AEROBIC/ANAEROBIC CULTURE W GRAM STAIN (SURGICAL/DEEP WOUND)

## 2023-01-23 SURGERY — REMOVAL, IMPLANT, BREAST
Anesthesia: General | Site: Breast | Laterality: Right

## 2023-01-23 MED ORDER — PHENYLEPHRINE 80 MCG/ML (10ML) SYRINGE FOR IV PUSH (FOR BLOOD PRESSURE SUPPORT)
PREFILLED_SYRINGE | INTRAVENOUS | Status: DC | PRN
  Administered 2023-01-23 (×2): 80 ug via INTRAVENOUS

## 2023-01-23 MED ORDER — BACITRACIN ZINC 500 UNIT/GM EX OINT
TOPICAL_OINTMENT | CUTANEOUS | Status: AC
  Filled 2023-01-23: qty 28.35

## 2023-01-23 MED ORDER — DEXMEDETOMIDINE HCL IN NACL 200 MCG/50ML IV SOLN
INTRAVENOUS | Status: DC | PRN
  Administered 2023-01-23: 4 ug via INTRAVENOUS
  Administered 2023-01-23: 8 ug via INTRAVENOUS

## 2023-01-23 MED ORDER — PHENYLEPHRINE 80 MCG/ML (10ML) SYRINGE FOR IV PUSH (FOR BLOOD PRESSURE SUPPORT)
PREFILLED_SYRINGE | INTRAVENOUS | Status: AC
  Filled 2023-01-23: qty 10

## 2023-01-23 MED ORDER — EPINEPHRINE PF 1 MG/ML IJ SOLN
INTRAMUSCULAR | Status: AC
  Filled 2023-01-23: qty 1

## 2023-01-23 MED ORDER — ONDANSETRON HCL 4 MG/2ML IJ SOLN
INTRAMUSCULAR | Status: DC | PRN
  Administered 2023-01-23: 4 mg via INTRAVENOUS

## 2023-01-23 MED ORDER — FENTANYL CITRATE (PF) 100 MCG/2ML IJ SOLN
INTRAMUSCULAR | Status: DC | PRN
  Administered 2023-01-23 (×2): 50 ug via INTRAVENOUS

## 2023-01-23 MED ORDER — MORPHINE SULFATE (PF) 2 MG/ML IV SOLN: 1.0000 mg | INTRAVENOUS | Status: DC | PRN

## 2023-01-23 MED ORDER — DEXAMETHASONE SODIUM PHOSPHATE 10 MG/ML IJ SOLN
INTRAMUSCULAR | Status: AC
  Filled 2023-01-23: qty 1

## 2023-01-23 MED ORDER — STERILE WATER FOR IRRIGATION IR SOLN
Status: DC | PRN
  Administered 2023-01-23: 1000 mL

## 2023-01-23 MED ORDER — PIPERACILLIN-TAZOBACTAM 3.375 G IVPB
3.3750 g | Freq: Three times a day (TID) | INTRAVENOUS | Status: DC
  Administered 2023-01-23 – 2023-01-26 (×8): 3.375 g via INTRAVENOUS
  Filled 2023-01-23 (×9): qty 50

## 2023-01-23 MED ORDER — ACETAMINOPHEN 500 MG PO TABS
ORAL_TABLET | ORAL | Status: AC
  Filled 2023-01-23: qty 2

## 2023-01-23 MED ORDER — FENTANYL CITRATE (PF) 100 MCG/2ML IJ SOLN
INTRAMUSCULAR | Status: AC
  Filled 2023-01-23: qty 2

## 2023-01-23 MED ORDER — BUPIVACAINE HCL (PF) 0.25 % IJ SOLN
INTRAMUSCULAR | Status: AC
  Filled 2023-01-23: qty 30

## 2023-01-23 MED ORDER — PROPOFOL 10 MG/ML IV BOLUS
INTRAVENOUS | Status: DC | PRN
  Administered 2023-01-23: 150 mg via INTRAVENOUS

## 2023-01-23 MED ORDER — DEXMEDETOMIDINE HCL IN NACL 80 MCG/20ML IV SOLN
INTRAVENOUS | Status: AC
  Filled 2023-01-23: qty 20

## 2023-01-23 MED ORDER — CHLORHEXIDINE GLUCONATE 0.12 % MT SOLN
OROMUCOSAL | Status: AC
  Filled 2023-01-23: qty 15

## 2023-01-23 MED ORDER — FENTANYL CITRATE (PF) 100 MCG/2ML IJ SOLN: 25.0000 ug | INTRAMUSCULAR | Status: DC | PRN

## 2023-01-23 MED ORDER — MIDAZOLAM HCL 2 MG/2ML IJ SOLN
INTRAMUSCULAR | Status: AC
  Filled 2023-01-23: qty 2

## 2023-01-23 MED ORDER — GABAPENTIN 300 MG PO CAPS
300.0000 mg | ORAL_CAPSULE | Freq: Once | ORAL | Status: AC
  Administered 2023-01-23: 300 mg via ORAL

## 2023-01-23 MED ORDER — LIDOCAINE HCL (PF) 2 % IJ SOLN
INTRAMUSCULAR | Status: AC
  Filled 2023-01-23: qty 5

## 2023-01-23 MED ORDER — ACETAMINOPHEN 500 MG PO TABS
1000.0000 mg | ORAL_TABLET | Freq: Once | ORAL | Status: AC
  Administered 2023-01-23: 1000 mg via ORAL

## 2023-01-23 MED ORDER — OXYCODONE HCL 5 MG PO TABS
5.0000 mg | ORAL_TABLET | Freq: Four times a day (QID) | ORAL | Status: DC | PRN
  Administered 2023-01-23 – 2023-01-25 (×4): 5 mg via ORAL
  Filled 2023-01-23 (×4): qty 1

## 2023-01-23 MED ORDER — PROPOFOL 10 MG/ML IV BOLUS
INTRAVENOUS | Status: AC
  Filled 2023-01-23: qty 20

## 2023-01-23 MED ORDER — LIDOCAINE-EPINEPHRINE 1 %-1:100000 IJ SOLN
INTRAMUSCULAR | Status: AC
  Filled 2023-01-23: qty 1

## 2023-01-23 MED ORDER — DEXAMETHASONE SODIUM PHOSPHATE 10 MG/ML IJ SOLN
INTRAMUSCULAR | Status: DC | PRN
  Administered 2023-01-23: 5 mg via INTRAVENOUS

## 2023-01-23 MED ORDER — OXYCODONE HCL 5 MG PO TABS
ORAL_TABLET | ORAL | Status: AC
  Filled 2023-01-23: qty 1

## 2023-01-23 MED ORDER — LIDOCAINE HCL (CARDIAC) PF 100 MG/5ML IV SOSY
PREFILLED_SYRINGE | INTRAVENOUS | Status: DC | PRN
  Administered 2023-01-23: 60 mg via INTRAVENOUS

## 2023-01-23 MED ORDER — ORAL CARE MOUTH RINSE: 15.0000 mL | Freq: Once | OROMUCOSAL | Status: AC

## 2023-01-23 MED ORDER — DROPERIDOL 2.5 MG/ML IJ SOLN: 0.6250 mg | Freq: Once | INTRAMUSCULAR | Status: DC | PRN

## 2023-01-23 MED ORDER — CHLORHEXIDINE GLUCONATE 0.12 % MT SOLN
15.0000 mL | Freq: Once | OROMUCOSAL | Status: AC
  Administered 2023-01-23: 15 mL via OROMUCOSAL

## 2023-01-23 MED ORDER — GABAPENTIN 300 MG PO CAPS
ORAL_CAPSULE | ORAL | Status: AC
  Filled 2023-01-23: qty 1

## 2023-01-23 MED ORDER — MIDAZOLAM HCL 2 MG/2ML IJ SOLN
INTRAMUSCULAR | Status: DC | PRN
  Administered 2023-01-23: 2 mg via INTRAVENOUS

## 2023-01-23 MED ORDER — OXYCODONE HCL 5 MG PO TABS
5.0000 mg | ORAL_TABLET | Freq: Once | ORAL | Status: AC
  Administered 2023-01-23: 5 mg via ORAL

## 2023-01-23 MED ORDER — ONDANSETRON HCL 4 MG/2ML IJ SOLN
INTRAMUSCULAR | Status: AC
  Filled 2023-01-23: qty 2

## 2023-01-23 SURGICAL SUPPLY — 38 items
BINDER BREAST BLACK XL (GAUZE/BANDAGES/DRESSINGS) IMPLANT
BLADE SURG 15 STRL LF DISP TIS (BLADE) ×1 IMPLANT
BLADE SURG 15 STRL SS (BLADE) ×1
BULB RESERV EVAC DRAIN JP 100C (MISCELLANEOUS) IMPLANT
CNTNR URN SCR LID CUP LEK RST (MISCELLANEOUS) IMPLANT
CONT SPEC 4OZ STRL OR WHT (MISCELLANEOUS) ×1
DRAIN CHANNEL JP 19F (MISCELLANEOUS) IMPLANT
DRAPE INCISE IOBAN 66X45 STRL (DRAPES) ×1 IMPLANT
DRAPE LAPAROTOMY 77X122 PED (DRAPES) ×1 IMPLANT
DRSG EMULSION OIL 3X3 NADH (GAUZE/BANDAGES/DRESSINGS) ×1 IMPLANT
DRSG TEGADERM 4X10 (GAUZE/BANDAGES/DRESSINGS) ×1 IMPLANT
DRSG TELFA 3X8 NADH STRL (GAUZE/BANDAGES/DRESSINGS) ×1 IMPLANT
ELECT CAUTERY BLADE 6.4 (BLADE) ×1 IMPLANT
ELECT REM PT RETURN 9FT ADLT (ELECTROSURGICAL) ×1
ELECTRODE REM PT RTRN 9FT ADLT (ELECTROSURGICAL) ×1 IMPLANT
GAUZE SPONGE 4X4 12PLY STRL (GAUZE/BANDAGES/DRESSINGS) ×1 IMPLANT
GLOVE BIO SURGEON STRL SZ 6.5 (GLOVE) ×2 IMPLANT
GOWN STRL REUS W/ TWL LRG LVL3 (GOWN DISPOSABLE) ×2 IMPLANT
GOWN STRL REUS W/TWL LRG LVL3 (GOWN DISPOSABLE) ×2
KIT TURNOVER KIT A (KITS) ×1 IMPLANT
MANIFOLD NEPTUNE II (INSTRUMENTS) ×1 IMPLANT
NDL HYPO 25X1 1.5 SAFETY (NEEDLE) ×1 IMPLANT
NEEDLE HYPO 25X1 1.5 SAFETY (NEEDLE) ×1 IMPLANT
NS IRRIG 1000ML POUR BTL (IV SOLUTION) ×1 IMPLANT
PAD ABD DERMACEA PRESS 5X9 (GAUZE/BANDAGES/DRESSINGS) IMPLANT
SOL PREP PVP 2OZ (MISCELLANEOUS) ×1
SOLUTION PREP PVP 2OZ (MISCELLANEOUS) ×1 IMPLANT
SPONGE T-LAP 18X18 ~~LOC~~+RFID (SPONGE) ×1 IMPLANT
SUT MNCRL 3-0 UNDYED SH (SUTURE) IMPLANT
SUT MNCRL 4-0 (SUTURE) ×1
SUT MNCRL 4-0 27XMFL (SUTURE) ×1
SUT PDS II 3-0 (SUTURE) IMPLANT
SUT SILK 3 0 SH 30 (SUTURE) IMPLANT
SUTURE MNCRL 4-0 27XMF (SUTURE) IMPLANT
SYR 10ML LL (SYRINGE) ×1 IMPLANT
TRAP FLUID SMOKE EVACUATOR (MISCELLANEOUS) ×1 IMPLANT
WATER STERILE IRR 1000ML POUR (IV SOLUTION) IMPLANT
WATER STERILE IRR 500ML POUR (IV SOLUTION) ×1 IMPLANT
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
## 2023-01-23 NOTE — Op Note (Addendum)
Op report   DATE OF OPERATION:  01/23/2023  LOCATION: [facility]  SURGICAL DIVISION: Plastic Surgery  PREOPERATIVE DIAGNOSES:  1. History of right breast cancer.  2. Seroma of right breast.   POSTOPERATIVE DIAGNOSES:  Same as preoperative diagnosis.   PROCEDURE:  Removal of right breast expander and Flex HD.  SURGEON: Foster Simpson, DO  ANESTHESIA:  General.   COMPLICATIONS: None.   INDICATIONS FOR PROCEDURE:  The patient, Tina Payne, is a 57 y.o. female born on 05-05-1966, is here for further treatment after a mastectomy and placement of a tissue expander. She was admitted for an increase in her white count.  She had a continual seroma of the right breast pocket.  The patient decided to have the expander removed. MRN: [id number]  CONSENT:  Informed consent was obtained directly from the patient. Risks, benefits and alternatives were fully discussed. Specific risks including but not limited to bleeding, infection, hematoma, seroma, scarring, pain, implant infection, implant extrusion, capsular contracture, asymmetry, wound healing problems, and need for further surgery were all discussed. The patient did have an ample opportunity to have her questions answered to her satisfaction.   DESCRIPTION OF PROCEDURE:  The patient was taken to the operating room. SCDs were placed. The patient's chest was prepped and draped in a sterile fashion. A time out was performed and the implants to be used were identified.   The old mastectomy scar was opened with the #10 blade.  The bovie was used to dissect to the expander and Flex HD (ADM).  The expander was deflated and removed.  All of the ADM was removed.  There was good integration starting of the ADM. The pocket was irrigated with saline and Arisept solution.  A drain was placed and secured to the skin with the 3-0 silk. New gloves were placed.  The pocket was closed with a 3-0 PDS suture. The remaining skin was closed with  3-0 Monocryl deep dermal and 4-0 Monocryl subcuticular stitches.  Dermabond was applied.  A breast binder and ABD was applied.  The patient was allowed to wake from anesthesia and taken to the recovery room in satisfactory condition. Cultures were sent from the ADM and pocket.

## 2023-01-23 NOTE — Transfer of Care (Signed)
Immediate Anesthesia Transfer of Care Note  Patient: Tina Payne  Procedure(s) Performed: REMOVAL BREAST TISSUE EXPANDER RIGHT SIDE (Right: Breast)  Patient Location: PACU  Anesthesia Type:General  Level of Consciousness: drowsy and patient cooperative  Airway & Oxygen Therapy: Patient Spontanous Breathing and Patient connected to face mask oxygen  Post-op Assessment: Report given to RN and Post -op Vital signs reviewed and stable  Post vital signs: Reviewed and stable  Last Vitals:  Vitals Value Taken Time  BP 112/77 01/23/23 0822  Temp    Pulse 69 01/23/23 0822  Resp 24 01/23/23 0822  SpO2 95 % 01/23/23 0822  Vitals shown include unvalidated device data.  Last Pain:  Vitals:   01/23/23 0705  TempSrc: Temporal  PainSc: 5       Patients Stated Pain Goal: 0 (01/22/23 2014)  Complications: No notable events documented.

## 2023-01-23 NOTE — Anesthesia Postprocedure Evaluation (Signed)
Anesthesia Post Note  Patient: Tina Payne  Procedure(s) Performed: REMOVAL BREAST TISSUE EXPANDER RIGHT SIDE (Right: Breast)  Patient location during evaluation: PACU Anesthesia Type: General Level of consciousness: awake and alert Pain management: pain level controlled Vital Signs Assessment: post-procedure vital signs reviewed and stable Respiratory status: spontaneous breathing, nonlabored ventilation and respiratory function stable Cardiovascular status: blood pressure returned to baseline and stable Postop Assessment: no apparent nausea or vomiting Anesthetic complications: no   No notable events documented.   Last Vitals:  Vitals:   01/23/23 0830 01/23/23 0907  BP: 117/79 122/77  Pulse: 71 64  Resp: 18 20  Temp:  (!) 36.4 C  SpO2: 93% 93%    Last Pain:  Vitals:   01/23/23 0907  TempSrc: Oral  PainSc:                  Foye Deer

## 2023-01-23 NOTE — Anesthesia Procedure Notes (Signed)
Procedure Name: LMA Insertion Date/Time: 01/23/2023 7:37 AM  Performed by: Omer Jack, CRNAPre-anesthesia Checklist: Patient identified, Patient being monitored, Timeout performed, Emergency Drugs available and Suction available Patient Re-evaluated:Patient Re-evaluated prior to induction Oxygen Delivery Method: Circle system utilized Preoxygenation: Pre-oxygenation with 100% oxygen Induction Type: IV induction Ventilation: Mask ventilation without difficulty LMA: LMA inserted LMA Size: 4.0 Tube type: Oral Number of attempts: 1 Placement Confirmation: positive ETCO2 and breath sounds checked- equal and bilateral Tube secured with: Tape Dental Injury: Teeth and Oropharynx as per pre-operative assessment

## 2023-01-23 NOTE — Progress Notes (Signed)
  Progress Note   Patient: Tina Payne XBM:[id number] DOB: [birth year] DOA: 01/17/2023     5 DOS: the patient was seen and examined on 01/23/2023   Brief hospital course: 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 She is admitted by general surgery to manage for postoperative infection.  We are consulted for managing diabetes. Hb A1C 9.9  Assessment and Plan:  * Post-operative infection Status post infected mastectomy site Per primary team/surgery Underwent removal of right breast expander and Flex HD   Hyperglycemia due to type 2 diabetes mellitus (HCC) Patient states she does not follow diabetic diet Complicated by postop infection in setting of mastectomy on 01/01/2023 Control infection per antibiotic and primary team Continue insulin sliding scale, insulin NovoLog 3 units subcu 3 times daily with each meals.  Continue insulin Semglee 12 units subcu daily Home metformin 500 mg with breakfast not resumed on admission Goal inpatient blood glucose levels 140-180   Overweight. Diet and excise.   Hypothyroidism, acquired, autoimmune Levothyroxine 75 mcg daily before breakfast resumed       Subjective: Sleepy.  Family member at bedside  Physical Exam: Vitals:   01/23/23 0705 01/23/23 0822 01/23/23 0830 01/23/23 0907  BP: (!) 121/90 112/77 117/79 122/77  Pulse: 70 69 71 64  Resp: 16 16 18 20   Temp: 99.1 F (37.3 C) 97.6 F (36.4 C)  (!) 97.5 F (36.4 C)  TempSrc: Temporal   Oral  SpO2: 96% 99% 93% 93%  Weight: 84.5 kg     Height: 5\' 8"  (1.727 m)      General exam: Appears calm and comfortable  Respiratory system: Clear to auscultation. Respiratory effort normal. Cardiovascular system: S1 & S2  heard, RRR. No JVD, murmurs, rubs, gallops or clicks. No pedal edema. Gastrointestinal system: Abdomen is nondistended, soft and nontender. No organomegaly or masses felt. Normal bowel sounds heard. Central nervous system: Sleepy no focal neurological deficits. Extremities: Symmetric 5 x 5 power. Skin: JP drain draining purulent drainage Psychiatry: Judgement and insight appear normal. Mood & affect appropriate.  Data Reviewed:  There are no new results to review at this time.  Family Communication: Brother at bedside     DVT prophylaxis-Lovenox Time spent: 15 minutes  Author: Delfino Lovett, MD 01/23/2023 4:13 PM  For on call review [email or web address].
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
## 2023-01-23 NOTE — Progress Notes (Signed)
Patient received from PACU following Right Breast Expander removal. Binder in place. Patient continues to be lethargic but arousable, answers questions appropriately. Family at bedside. Vital signs stable.  Tina Payne Gerianne Simonet
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
## 2023-01-23 NOTE — Progress Notes (Signed)
Pharmacy Antibiotic Note  Tina Payne is a 57 y.o. female w/ PMH of hypothyroidism,  invasive ductal carcinoma on the right s/p mastectomy on 01/01/2023, anxiety, chronic anemia, obesity admitted on 01/17/2023 with cellulitis.  Breast expander removed by plastics 6/7. OR culture growing GPCs. Drain culture from 6/2 with Pseudomonas fluorescens, MSSA, and rare Corynebacterium spp. Pseudomonas and Corynebacterium resulted late from 6/2 culture (on 6/6)- of questionable clinical significance. Cefepime not reported from [facility] for Pseudomonas fluorescens. Pharmacy consulted to dose Zosyn (MIC 8).   Plan:   - START Zosyn 3.375 g IV Q8H (EI)  - monitor renal function for needed dose adjustments  Height: 5\' 8"  (172.7 cm) Weight: 84.5 kg (186 lb 4.6 oz) IBW/kg (Calculated) : 63.9  Temp (24hrs), Avg:98.2 F (36.8 C), Min:97.5 F (36.4 C), Max:99.1 F (37.3 C)  Recent Labs  Lab 01/18/23 0010 01/19/23 0850 01/19/23 0852 01/20/23 0504 01/21/23 0406 01/22/23 0518 01/23/23 0514  WBC 19.4*  --  13.2* 10.8* 11.4* 15.0* 12.4*  CREATININE 0.93 0.77  --  0.76 0.65 0.71 0.65  LATICACIDVEN 1.8  --   --   --   --   --   --      Estimated Creatinine Clearance: 87.2 mL/min (by C-G formula based on SCr of 0.65 mg/dL).    No Known Allergies  Antimicrobials this admission: 06/02 vancomycin >> 06/04 06/02 metronidazole >> 06/04 06/04 cefazolin >> 06/06 06/02 cefepime >> 06/04  06/06 >>6/7 06/07 Zosyn>>c   Microbiology results: 06/02 BCx: NG x 4 days 06/02 UCx: insignificant growth  06/02 WCx: P fluorescens, S aureus  06/07 OR Cx: GPCs   Thank you for allowing pharmacy to be a part of this patient's care.  Jani Gravel, PharmD PGY-2 Infectious Diseases Resident  01/23/2023 1:44 PM

## 2023-01-24 ENCOUNTER — Encounter: Payer: Self-pay | Admitting: Plastic Surgery

## 2023-01-24 DIAGNOSIS — T8140XA Infection following a procedure, unspecified, initial encounter: Secondary | ICD-10-CM | POA: Diagnosis not present

## 2023-01-24 DIAGNOSIS — E1165 Type 2 diabetes mellitus with hyperglycemia: Secondary | ICD-10-CM | POA: Diagnosis not present

## 2023-01-24 LAB — BASIC METABOLIC PANEL
Anion gap: 11 (ref 5–15)
BUN: 15 mg/dL (ref 6–20)
CO2: 24 mmol/L (ref 22–32)
Calcium: 9.3 mg/dL (ref 8.9–10.3)
Chloride: 103 mmol/L (ref 98–111)
Creatinine, Ser: 0.72 mg/dL (ref 0.44–1.00)
GFR, Estimated: >60 mL/min (ref >60)
Glucose, Bld: 176 mg/dL — ABNORMAL HIGH (ref 70–99)
Potassium: 4.1 mmol/L (ref 3.5–5.1)
Sodium: 138 mmol/L (ref 135–145)

## 2023-01-24 LAB — CBC
HCT: 32.9 % — ABNORMAL LOW (ref 36.0–46.0)
Hemoglobin: 10 g/dL — ABNORMAL LOW (ref 12.0–15.0)
MCH: 24.4 pg — ABNORMAL LOW (ref 26.0–34.0)
MCHC: 30.4 g/dL (ref 30.0–36.0)
MCV: 80.4 fL (ref 80.0–100.0)
Platelets: 445 10*3/uL — ABNORMAL HIGH (ref 150–400)
RBC: 4.09 MIL/uL (ref 3.87–5.11)
RDW: 14.1 % (ref 11.5–15.5)
WBC: 14.1 10*3/uL — ABNORMAL HIGH (ref 4.0–10.5)
nRBC: 0 % (ref 0.0–0.2)

## 2023-01-24 LAB — GLUCOSE, CAPILLARY
Glucose-Capillary: 160 mg/dL — ABNORMAL HIGH (ref 70–99)
Glucose-Capillary: 246 mg/dL — ABNORMAL HIGH (ref 70–99)
Glucose-Capillary: 171 mg/dL — ABNORMAL HIGH (ref 70–99)
Glucose-Capillary: 181 mg/dL — ABNORMAL HIGH (ref 70–99)

## 2023-01-24 LAB — AEROBIC/ANAEROBIC CULTURE W GRAM STAIN (SURGICAL/DEEP WOUND)

## 2023-01-24 MED ORDER — PANTOPRAZOLE SODIUM 40 MG PO TBEC
40.0000 mg | DELAYED_RELEASE_TABLET | Freq: Every day | ORAL | Status: DC
  Administered 2023-01-24 – 2023-01-25 (×2): 40 mg via ORAL
  Filled 2023-01-24 (×3): qty 1

## 2023-01-24 MED ORDER — LACTULOSE 10 GM/15ML PO SOLN
20.0000 g | Freq: Once | ORAL | Status: AC
  Administered 2023-01-24: 20 g via ORAL
  Filled 2023-01-24: qty 30

## 2023-01-24 MED ORDER — INSULIN ASPART 100 UNIT/ML IJ SOLN
4.0000 [IU] | Freq: Three times a day (TID) | INTRAMUSCULAR | Status: DC
  Administered 2023-01-24 – 2023-01-26 (×8): 4 [IU] via SUBCUTANEOUS
  Filled 2023-01-24 (×8): qty 1

## 2023-01-24 MED ORDER — ENOXAPARIN SODIUM 40 MG/0.4ML IJ SOSY
40.0000 mg | PREFILLED_SYRINGE | INTRAMUSCULAR | Status: DC
  Administered 2023-01-24 – 2023-01-25 (×2): 40 mg via SUBCUTANEOUS
  Filled 2023-01-24 (×2): qty 0.4

## 2023-01-24 NOTE — Progress Notes (Signed)
  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 She is admitted by general surgery to manage for postoperative infection.  We are consulted for managing diabetes. 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)   Hypothyroidism, acquired, autoimmune   Uncontrolled type 2 diabetes mellitus with hyperglycemia, without long-term current use of insulin (HCC)   Pure hypercholesterolemia   Breast cancer (HCC)   Sepsis (HCC)   Hyperglycemia due to type 2 diabetes mellitus (HCC)   Overweight (BMI 25.0-29.9)   Hypokalemia   Seroma of breast   Assessment and Plan: * Post-operative infection Status post infected mastectomy site Per primary team/surgery Underwent removal of right breast expander and Flex HD   Hyperglycemia due to type 2 diabetes mellitus (HCC) Continue insulin glargine 12 units daily, slightly increasing NovoLog to 4 units 3 times a day in addition to/IV insulin.   Overweight. Diet and excise.   Hypothyroidism, acquired, autoimmune Levothyroxine 75 mcg daily before breakfast resumed      Subjective:  Patient does not have any complaint today.  Physical Exam: Vitals:   01/23/23 1626 01/23/23 1949 01/24/23 0456 01/24/23 0741  BP: 115/66 110/60 113/63 112/66  Pulse: 85 73 69 61  Resp: 20 18 18 18   Temp: 98.6 F (37 C) 98.3 F (36.8 C) 97.9 F (36.6 C) 98.1 F (36.7 C)  TempSrc: Oral  Oral   SpO2: 94% 97% 96% 96%   Weight:      Height:       General exam: Appears calm and comfortable  Respiratory system: Clear to auscultation. Respiratory effort normal. Cardiovascular system: S1 & S2 heard, RRR. No JVD, murmurs, rubs, gallops or clicks. No pedal edema. Gastrointestinal system: Abdomen is nondistended, soft and nontender. No organomegaly or masses felt. Normal bowel sounds heard. Central nervous system: Alert and oriented. No focal neurological deficits. Extremities: Symmetric 5 x 5 power. Skin: No rashes, lesions or ulcers Psychiatry: Judgement and insight appear normal. Mood & affect appropriate.    Data Reviewed:  Lab results reviewed.  Family Communication: None  Disposition: Status is: Inpatient Remains inpatient appropriate because: Per primary team.     Time spent: 35 minutes  Author: Marrion Coy, MD 01/24/2023 12:42 PM  For on call review [email or web address].

## 2023-01-24 NOTE — Progress Notes (Signed)
Patient ID: Tina Payne, female   DOB: 08-11-1966, 57 y.o.   MRN: [id number]     SURGICAL PROGRESS NOTE   Hospital Day(s): 6.   Interval History: Patient seen and examined, no acute events or new complaints overnight. Patient reports feeling well.  She denies any new complaint.  Expected pain but controlled with current premedication in the right chest.  Vital signs in last 24 hours: [min-max] current  Temp:  [97.9 F (36.6 C)-98.6 F (37 C)] 98.1 F (36.7 C) (06/08 0741) Pulse Rate:  [61-85] 61 (06/08 0741) Resp:  [18-20] 18 (06/08 0741) BP: (110-115)/(60-66) 112/66 (06/08 0741) SpO2:  [94 %-97 %] 96 % (06/08 0741)     Height: 5\' 8"  (172.7 cm) Weight: 84.5 kg BMI (Calculated): 28.33   Physical Exam:  Constitutional: alert, cooperative and no distress  Chest: Right chest wound without any significant fluid collection.  Drain in place, serosanguineous.  Labs:     Latest Ref Rng & Units 01/24/2023    4:50 AM 01/23/2023    5:14 AM 01/22/2023    5:18 AM  CBC  WBC 4.0 - 10.5 K/uL 14.1  12.4  15.0   Hemoglobin 12.0 - 15.0 g/dL 86.5  78.4  69.6   Hematocrit 36.0 - 46.0 % 32.9  35.0  36.5   Platelets 150 - 400 K/uL 445  427  429       Latest Ref Rng & Units 01/24/2023    4:50 AM 01/23/2023    5:14 AM 01/22/2023    5:18 AM  CMP  Glucose 70 - 99 mg/dL 295  284  132   BUN 6 - 20 mg/dL 15  13  14    Creatinine 0.44 - 1.00 mg/dL 4.40  1.02  7.25   Sodium 135 - 145 mmol/L 138  140  139   Potassium 3.5 - 5.1 mmol/L 4.1  4.0  4.0   Chloride 98 - 111 mmol/L 103  106  103   CO2 22 - 32 mmol/L 24  25  24    Calcium 8.9 - 10.3 mg/dL 9.3  9.1  9.8     Imaging studies: No new pertinent imaging studies   Assessment/Plan:  57 y.o. female with excess aroma of right breast 1 Day Post-Op s/p removal of right breast expander, complicated by pertinent comorbidities including right breast cancer.  -Patient recovering well from surgery yesterday from removal of expander -Drain  serosanguineous -Current cultured with multi sensitive staph lower than Pseudomonas.  Will continue with IV antibiotic therapy.  There was some mild increase in white blood cell count yesterday, most likely postop -Will continue with drain in place -Will start DVT prophylaxis since patient is high risk due to history of malignancy, current infection and postoperative state. -Encouraged the patient to ambulate  Gae Gallop, MD

## 2023-01-25 DIAGNOSIS — E1165 Type 2 diabetes mellitus with hyperglycemia: Secondary | ICD-10-CM | POA: Diagnosis not present

## 2023-01-25 DIAGNOSIS — T8140XA Infection following a procedure, unspecified, initial encounter: Secondary | ICD-10-CM | POA: Diagnosis not present

## 2023-01-25 LAB — GLUCOSE, CAPILLARY
Glucose-Capillary: 146 mg/dL — ABNORMAL HIGH (ref 70–99)
Glucose-Capillary: 311 mg/dL — ABNORMAL HIGH (ref 70–99)
Glucose-Capillary: 78 mg/dL (ref 70–99)
Glucose-Capillary: 172 mg/dL — ABNORMAL HIGH (ref 70–99)

## 2023-01-25 LAB — CBC
HCT: 33.5 % — ABNORMAL LOW (ref 36.0–46.0)
Hemoglobin: 10.3 g/dL — ABNORMAL LOW (ref 12.0–15.0)
MCH: 25.1 pg — ABNORMAL LOW (ref 26.0–34.0)
MCHC: 30.7 g/dL (ref 30.0–36.0)
MCV: 81.5 fL (ref 80.0–100.0)
Platelets: 414 10*3/uL — ABNORMAL HIGH (ref 150–400)
RBC: 4.11 MIL/uL (ref 3.87–5.11)
RDW: 14.5 % (ref 11.5–15.5)
WBC: 12.3 10*3/uL — ABNORMAL HIGH (ref 4.0–10.5)
nRBC: 0 % (ref 0.0–0.2)

## 2023-01-25 LAB — AEROBIC/ANAEROBIC CULTURE W GRAM STAIN (SURGICAL/DEEP WOUND)

## 2023-01-25 NOTE — Progress Notes (Signed)
  Progress Note   Patient: Tina Payne ZOX:[id number] DOB: [birth year] DOA: 01/17/2023     7 DOS: the patient was seen and examined on 01/25/2023   Brief hospital course: 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 She is admitted by general surgery to manage for postoperative infection.  We are consulted for managing diabetes. 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  Patient may need a prescription of insulin at the time when surgery discharge patient.   Overweight. Diet and excise.   Hypothyroidism, acquired, autoimmune Continue Synthroid.      Subjective:  Patient has no complaint today.  Physical Exam: Vitals:   01/24/23 1527 01/24/23 1941 01/25/23 0340 01/25/23 0800  BP: (!) 100/58 103/67 (!) 111/58 121/71  Pulse: 65 67 66 63  Resp: 18 18 18 18   Temp: 98 F (36.7 C) 98.4 F (36.9 C) 98.3 F (36.8 C) 97.9 F (36.6 C)  TempSrc:  Oral Oral Oral  SpO2: 97% 98% 96% 98%  Weight:       Height:       General exam: Appears calm and comfortable  Respiratory system: Clear to auscultation. Respiratory effort normal. Cardiovascular system: S1 & S2 heard, RRR. No JVD, murmurs, rubs, gallops or clicks. No pedal edema. Gastrointestinal system: Abdomen is nondistended, soft and nontender. No organomegaly or masses felt. Normal bowel sounds heard. Central nervous system: Alert and oriented. No focal neurological deficits. Extremities: Symmetric 5 x 5 power. Skin: No rashes, lesions or ulcers Psychiatry: Judgement and insight appear normal. Mood & affect appropriate.    Data Reviewed:  Lab results reviewed.  Family Communication: None  Disposition: Status is: Inpatient Remains inpatient appropriate because: Per general surgery.     Time spent: 35 minutes  Author: Marrion Coy, MD 01/25/2023 10:13 AM  For on call review [email or web address].

## 2023-01-25 NOTE — Progress Notes (Signed)
Patient ID: Tina Payne, female   DOB: November 30, 1965, 57 y.o.   MRN: [id number]     SURGICAL PROGRESS NOTE   Hospital Day(s): 7.   Interval History: Patient seen and examined, no acute events or new complaints overnight. Patient reports well.  She denies any complaint.  Vital signs in last 24 hours: [min-max] current  Temp:  [97.9 F (36.6 C)-98.4 F (36.9 C)] 97.9 F (36.6 C) (06/09 0800) Pulse Rate:  [63-67] 63 (06/09 0800) Resp:  [18] 18 (06/09 0800) BP: (100-121)/(58-71) 121/71 (06/09 0800) SpO2:  [96 %-98 %] 98 % (06/09 0800)     Height: 5\' 8"  (172.7 cm) Weight: 84.5 kg BMI (Calculated): 28.33   Physical Exam:  Constitutional: alert, cooperative and no distress  Chest: Right chest wound dry and clean.  No significant fluid collection.  Drain is serosanguineous.  Labs:     Latest Ref Rng & Units 01/25/2023    4:56 AM 01/24/2023    4:50 AM 01/23/2023    5:14 AM  CBC  WBC 4.0 - 10.5 K/uL 12.3  14.1  12.4   Hemoglobin 12.0 - 15.0 g/dL 13.0  86.5  78.4   Hematocrit 36.0 - 46.0 % 33.5  32.9  35.0   Platelets 150 - 400 K/uL 414  445  427       Latest Ref Rng & Units 01/24/2023    4:50 AM 01/23/2023    5:14 AM 01/22/2023    5:18 AM  CMP  Glucose 70 - 99 mg/dL 696  295  284   BUN 6 - 20 mg/dL 15  13  14    Creatinine 0.44 - 1.00 mg/dL 1.32  4.40  1.02   Sodium 135 - 145 mmol/L 138  140  139   Potassium 3.5 - 5.1 mmol/L 4.1  4.0  4.0   Chloride 98 - 111 mmol/L 103  106  103   CO2 22 - 32 mmol/L 24  25  24    Calcium 8.9 - 10.3 mg/dL 9.3  9.1  9.8     Imaging studies: No new pertinent imaging studies   Assessment/Plan:  57 y.o. female with excess aroma of right breast 2 Day Post-Op s/p removal of right breast expander, complicated by pertinent comorbidities including right breast cancer.  -No clinical deterioration -Pain control -Drain serosanguineous.  No sign of recurring abscess -White blood cell trending down today 12,000.  Will continue with IV antibiotic therapy.   Hopefully they should normalize within the next 24 to 48 hours.  Gae Gallop, MD

## 2023-01-26 ENCOUNTER — Other Ambulatory Visit: Payer: Self-pay

## 2023-01-26 DIAGNOSIS — Z9011 Acquired absence of right breast and nipple: Secondary | ICD-10-CM | POA: Diagnosis not present

## 2023-01-26 DIAGNOSIS — A4901 Methicillin susceptible Staphylococcus aureus infection, unspecified site: Secondary | ICD-10-CM

## 2023-01-26 DIAGNOSIS — C50911 Malignant neoplasm of unspecified site of right female breast: Secondary | ICD-10-CM | POA: Diagnosis not present

## 2023-01-26 LAB — CBC
HCT: 35 % — ABNORMAL LOW (ref 36.0–46.0)
Hemoglobin: 10.7 g/dL — ABNORMAL LOW (ref 12.0–15.0)
MCH: 24.6 pg — ABNORMAL LOW (ref 26.0–34.0)
MCHC: 30.6 g/dL (ref 30.0–36.0)
MCV: 80.5 fL (ref 80.0–100.0)
Platelets: 452 10*3/uL — ABNORMAL HIGH (ref 150–400)
RBC: 4.35 MIL/uL (ref 3.87–5.11)
RDW: 14.5 % (ref 11.5–15.5)
WBC: 12.3 10*3/uL — ABNORMAL HIGH (ref 4.0–10.5)
nRBC: 0.2 % (ref 0.0–0.2)

## 2023-01-26 LAB — GLUCOSE, CAPILLARY
Glucose-Capillary: 163 mg/dL — ABNORMAL HIGH (ref 70–99)
Glucose-Capillary: 175 mg/dL — ABNORMAL HIGH (ref 70–99)
Glucose-Capillary: 238 mg/dL — ABNORMAL HIGH (ref 70–99)

## 2023-01-26 LAB — AEROBIC/ANAEROBIC CULTURE W GRAM STAIN (SURGICAL/DEEP WOUND)

## 2023-01-26 MED ORDER — OXYCODONE-ACETAMINOPHEN 5-325 MG PO TABS: 1.0000 | ORAL_TABLET | Freq: Four times a day (QID) | ORAL | 0 refills | Status: AC | PRN

## 2023-01-26 MED ORDER — CEFADROXIL 1 G PO TABS: 1.0000 g | ORAL_TABLET | Freq: Two times a day (BID) | ORAL | 0 refills | Status: AC

## 2023-01-26 NOTE — Plan of Care (Signed)
IV removed, discharge instructions reviewed and patient discharged to home 

## 2023-01-26 NOTE — Final Progress Note (Signed)
Patient is medically stable for discharge.  Blood sugars been well-controlled.  Primary team is planning on discharge today.

## 2023-01-26 NOTE — TOC Transition Note (Signed)
Transition of Care [facility]) - CM/SW Discharge Note   Patient Details  Name: Tina Payne MRN: [id number] Date of Birth: Feb 02, 1966  [facility] CM/SW Contact:  Chapman Fitch, RN Phone Number: 01/26/2023, 10:13 AM   Clinical Narrative:     Patient to discharge today Confirmed with MD that patient will discharge on oral antibiotics and no TOC needs         Patient Goals and CMS Choice      Discharge Placement                         Discharge Plan and Services Additional resources added to the After Visit Summary for                                       Social Determinants of Health (SDOH) Interventions SDOH Screenings   Food Insecurity: No Food Insecurity (01/18/2023)  Housing: Low Risk  (01/18/2023)  Transportation Needs: No Transportation Needs (01/18/2023)  Utilities: Not At Risk (01/18/2023)  Depression (PHQ2-9): Low Risk  (11/25/2022)  Tobacco Use: Medium Risk (01/24/2023)     Readmission Risk Interventions     No data to display

## 2023-01-26 NOTE — Progress Notes (Signed)
Date of Admission:  01/17/2023      ID: Tina Payne is a 57 y.o. 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)   Hypothyroidism, acquired, autoimmune   Uncontrolled type 2 diabetes mellitus with hyperglycemia, without long-term current use of insulin (HCC)   Pure hypercholesterolemia   Breast cancer (HCC)   Sepsis (HCC)   Hyperglycemia due to type 2 diabetes mellitus (HCC)   Overweight (BMI 25.0-29.9)   Hypokalemia   Seroma of breast    Subjective: Pt doing better Had explantation of expander and Fex HD on 01/23/23   Medications:   atorvastatin  20 mg Oral QHS   bisacodyl  10 mg Rectal Once   enoxaparin (LOVENOX) injection  40 mg Subcutaneous Q24H   insulin aspart  0-5 Units Subcutaneous QHS   insulin aspart  0-9 Units Subcutaneous TID WC   insulin aspart  4 Units Subcutaneous TID WC   insulin glargine-yfgn  12 Units Subcutaneous Q2000   levothyroxine  75 mcg Oral QAC breakfast   pantoprazole  40 mg Oral Daily   polyethylene glycol  17 g Oral Daily   senna-docusate  2 tablet Oral BID    Objective: Vital signs in last 24 hours: Patient Vitals for the past 24 hrs:  BP Temp Pulse Resp SpO2  01/26/23 0835 129/81 98 F (36.7 C) 71 18 96 %  01/26/23 0542 (!) 106/59 98.3 F (36.8 C) 71 16 96 %  01/25/23 2021 104/68 98.1 F (36.7 C) 67 18 96 %      PHYSICAL EXAM:  General: Alert, cooperative, no distress, appears stated age.  Lungs: Clear to auscultation bilaterally. No Wheezing or Rhonchi. No rales. Heart: Regular rate and rhythm, no murmur, rub or gallop. Breast binder Abdomen: Soft, non-tender,not distended. Bowel sounds normal. No masses Extremities: atraumatic, no cyanosis. No edema. No clubbing Skin: No rashes or lesions. Or bruising Lymph: Cervical, supraclavicular normal. Neurologic: Grossly non-focal  Lab Results    Latest Ref Rng & Units  01/26/2023    4:56 AM 01/25/2023    4:56 AM 01/24/2023    4:50 AM  CBC  WBC 4.0 - 10.5 K/uL 12.3  12.3  14.1   Hemoglobin 12.0 - 15.0 g/dL 16.1  09.6  04.5   Hematocrit 36.0 - 46.0 % 35.0  33.5  32.9   Platelets 150 - 400 K/uL 452  414  445        Latest Ref Rng & Units 01/24/2023    4:50 AM 01/23/2023    5:14 AM 01/22/2023    5:18 AM  CMP  Glucose 70 - 99 mg/dL 409  811  914   BUN 6 - 20 mg/dL 15  13  14    Creatinine 0.44 - 1.00 mg/dL 7.82  9.56  2.13   Sodium 135 - 145 mmol/L 138  140  139   Potassium 3.5 - 5.1 mmol/L 4.1  4.0  4.0   Chloride 98 - 111 mmol/L 103  106  103   CO2 22 - 32 mmol/L 24  25  24    Calcium 8.9 - 10.3 mg/dL 9.3  9.1  9.8       Microbiology: Multiple cultures taken from rt breast bed 01/23/23 X 3 were Staph aureus- MSSA    Assessment/Plan: Rt simple mastectomy for ca breast with same day reconstruction With expander and Drain Infection of the mastectomy site s/p removal of expander  and flex HD MSSA surgical site infection Had pseudomonas from the drain which was likely colonizing the drain as there is no pseudomonas in the three cultures taken from the bed  Pt is currently on cefepime and is ready to go home- can be discharged on cefadroxil  for 7-10 days   Anemia  DM on insulin and metformin  Hypothyroidism on synthroid  HLD on atorvastatin Discussed the management with the patient and Dr.Sakai

## 2023-01-26 NOTE — Discharge Summary (Signed)
Physician Discharge Summary  Patient ID: Tina Payne MRN: [id number] DOB/AGE: March 12, 1966 57 y.o.  Admit date: 01/17/2023 Discharge date: 01/26/2023  Admission Diagnoses: Mastectomy site and expander placement infection, right  Discharge Diagnoses:  Same as above  Discharged Condition: good  Hospital Course: admitted for above.  Decreasing leukocytosis initially noted with IV antibiotics alone and continued drainage into the JP drain of purulent fluid.  Patient remained minimally symptomatic clinically during this time.    She unfortunately started developing increasing leukocytosis with decreased purulent drainage from the JP.  Repeat CT scan did not note any persistent fluid pocket in the area, but due to the increasing leukocytosis, ID consult placed for antibiotic management and plastics recommended removal of expander.    Patient underwent removal of expander and postop monitoring of her leukocytosis.  Final cultures reviewed by ID and they deemed patient appropriate for discharge on oral antibiotics with decreasing leukocytosis at time of discharge.  Patient fortunately remained minimally symptomatic throughout the entire stay including her postoperative course, with pain controlled, hemoglobin stable, and JP with no evidence of additional purulent drainage.     At time of d/c, tolerating diet and pain controlled, discharged home on oral antibiotics.  Hospitalist also consulted during her hospital stay for better diabetes education and monitoring of her glucose levels.  Consults: ID and plastics and hospitalist blood glucose  Discharge Exam: Blood pressure 129/81, pulse 71, temperature 98 F (36.7 C), resp. rate 18, height 5\' 8"  (1.727 m), weight 84.5 kg, SpO2 96 %. General appearance: alert, cooperative, and no distress Chest wall: Binder in place, JP with old sanguinous fluid, tenderness to palpation as expected.  Disposition:  Discharge disposition: 01-Home or Self  Care       Discharge Instructions     Amb Referral to Nutrition and Diabetic Education   Complete by: As directed    Discharge patient   Complete by: As directed    Discharge disposition: 01-Home or Self Care   Discharge patient date: 01/26/2023      Allergies as of 01/26/2023   No Known Allergies      Medication List     STOP taking these medications    cephALEXin 500 MG capsule Commonly known as: Keflex       TAKE these medications    ALPRAZolam 0.5 MG tablet Commonly known as: XANAX Take 0.5 mg by mouth 2 (two) times daily as needed.   atorvastatin 20 MG tablet Commonly known as: LIPITOR Take 20 mg by mouth daily.   calcium carbonate 750 MG chewable tablet Commonly known as: TUMS EX Chew 2 tablets by mouth as needed for heartburn.   cefadroxil 1 g tablet Commonly known as: DURICEF Take 1 tablet (1 g total) by mouth 2 (two) times daily for 10 days.   Cholecalciferol 50 MCG (2000 UT) Tabs Take 5,000 Units by mouth daily.   ibuprofen 800 MG tablet Commonly known as: ADVIL Take 1 tablet (800 mg total) by mouth every 8 (eight) hours as needed. What changed: Another medication with the same name was removed. Continue taking this medication, and follow the directions you see here.   levothyroxine 75 MCG tablet Commonly known as: SYNTHROID Take 75 mcg by mouth daily before breakfast.   metFORMIN 500 MG 24 hr tablet Commonly known as: GLUCOPHAGE-XR Take 500 mg by mouth daily with breakfast.   omeprazole 40 MG capsule Commonly known as: PRILOSEC Take 40 mg by mouth daily.   ondansetron 4 MG tablet Commonly known  as: Zofran Take 1 tablet (4 mg total) by mouth every 8 (eight) hours as needed for nausea or vomiting.   oxyCODONE-acetaminophen 5-325 MG tablet Commonly known as: PERCOCET/ROXICET Take 1 tablet by mouth every 6 (six) hours as needed for severe pain.        Follow-up Information     Dillingham, Alena Bills, DO Follow up in 10 day(s).    Specialty: Plastic Surgery Why: 01/30/23 2:15 PM Contact information: [address] [place] [address] [phone]                  Total time spent arranging discharge was >41min. Signed: Sung Amabile 01/26/2023, 2:05 PM

## 2023-01-27 ENCOUNTER — Encounter: Payer: Self-pay | Admitting: *Deleted

## 2023-01-27 ENCOUNTER — Encounter: Payer: Self-pay | Admitting: Oncology

## 2023-01-27 ENCOUNTER — Other Ambulatory Visit: Payer: Self-pay | Admitting: Physician Assistant

## 2023-01-27 ENCOUNTER — Inpatient Hospital Stay: Payer: 59 | Attending: Oncology | Admitting: Oncology

## 2023-01-27 ENCOUNTER — Telehealth: Payer: Self-pay | Admitting: Plastic Surgery

## 2023-01-27 VITALS — BP 109/73 | HR 69 | Temp 97.5°F

## 2023-01-27 DIAGNOSIS — C50911 Malignant neoplasm of unspecified site of right female breast: Secondary | ICD-10-CM | POA: Diagnosis not present

## 2023-01-27 DIAGNOSIS — Z170 Estrogen receptor positive status [ER+]: Secondary | ICD-10-CM | POA: Insufficient documentation

## 2023-01-27 DIAGNOSIS — Z9011 Acquired absence of right breast and nipple: Secondary | ICD-10-CM | POA: Diagnosis not present

## 2023-01-27 DIAGNOSIS — C50211 Malignant neoplasm of upper-inner quadrant of right female breast: Secondary | ICD-10-CM | POA: Insufficient documentation

## 2023-01-27 DIAGNOSIS — R748 Abnormal levels of other serum enzymes: Secondary | ICD-10-CM | POA: Insufficient documentation

## 2023-01-27 DIAGNOSIS — C50919 Malignant neoplasm of unspecified site of unspecified female breast: Secondary | ICD-10-CM | POA: Diagnosis not present

## 2023-01-27 MED ORDER — ANASTROZOLE 1 MG PO TABS: 1.0000 mg | ORAL_TABLET | Freq: Every day | ORAL | 2 refills | Status: DC

## 2023-01-27 MED ORDER — DIAZEPAM 2 MG PO TABS: 2.0000 mg | ORAL_TABLET | Freq: Four times a day (QID) | ORAL | 0 refills | Status: AC | PRN

## 2023-01-27 NOTE — Assessment & Plan Note (Signed)
Likely due to previous alcohol use. Level has improved, She has stopped drinking alcohol for 4 months.  Will recheck at the next visit.
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
## 2023-01-27 NOTE — Telephone Encounter (Signed)
Pt. Called and requested an Rx for muscle spasms be called in for her.  Pt also states that she is having burning at the site where the tubes are and it feels like it is sticking to her skin. She has an upcoming appt. On Fri 6/08/21/22.  Please call her at [phone].

## 2023-01-27 NOTE — Assessment & Plan Note (Addendum)
Pathology and imaging findings were reviewed with patient.  pT1c pN0 right multifocal invasive mammary carcinoma rising on a background of high grade DCIS,  ER>90%, PR 80%, HER2 negative [facility] 0]  s/p mastectomy.  Oncotype DX score is 10, no chemotherapy benefit. No need for adjuvant radiation as she has had a mastectomy. Recommend adjuvant endocrine therapy Rationale of using aromatase inhibitor -Arimidex  discussed with patient.  Side effects of Arimidex including but not limited to hot flash, muscle and joint pain, fatigue, mood swing, vaginal dryness/inching, loss of bone mineral density,hair thinning, heart problem, etc were discussed with patient.  Prescription was sent to pharmacy.  She may start after her J-tube is removed. Obtain baseline DEXA.

## 2023-01-27 NOTE — Progress Notes (Signed)
Met with Tina Payne at her follow up appointment with Dr. Cathie Hoops.  She is doing well since getting discharged from the hospital yesterday with post op infection.

## 2023-01-27 NOTE — Progress Notes (Signed)
I spoke with the patient today, she notes that she had some muscle spasms this morning along her surgical region, she notes that when she got up and started moving around the dissipated.  She notes that she feels like the drain site is stuck to her skin, but has not taken off the binder to visualize it.  I explained to her that likely the drain is not only sewed into the skin but also she would have a dressing over the top of that and that is likely what she is feeling.  It was not terribly bothersome to her.  Otherwise she is doing well.  For the muscle spasms I do feel comfortable sending in 2 mg Valium as needed, she understands not to take this with any other benzodiazepine or pain medication as it may cause synergistic effects.  She has a follow-up appointment in our office in 3 days, she will reach out to our office if she develops any concerning signs or symptoms.  She was happy with today's plan.

## 2023-01-27 NOTE — Telephone Encounter (Signed)
Thanks I was able to speak with her today

## 2023-01-28 ENCOUNTER — Inpatient Hospital Stay: Payer: 59 | Admitting: Occupational Therapy

## 2023-01-28 DIAGNOSIS — M25611 Stiffness of right shoulder, not elsewhere classified: Secondary | ICD-10-CM

## 2023-01-28 LAB — AEROBIC/ANAEROBIC CULTURE W GRAM STAIN (SURGICAL/DEEP WOUND)
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
## 2023-01-30 ENCOUNTER — Ambulatory Visit (INDEPENDENT_AMBULATORY_CARE_PROVIDER_SITE_OTHER): Payer: 59 | Admitting: Surgical

## 2023-01-30 DIAGNOSIS — C50911 Malignant neoplasm of unspecified site of right female breast: Secondary | ICD-10-CM

## 2023-01-30 DIAGNOSIS — Z9889 Other specified postprocedural states: Secondary | ICD-10-CM

## 2023-01-30 NOTE — Progress Notes (Signed)
Patient is a 57 year old female here for follow-up after removal of right breast tissue expander with Dr. Ulice Bold on 01/23/2023 after right breast mastectomy and immediate breast reconstruction with Dr. Tonna Boehringer and Dr. Ulice Bold on 01/01/2023.  She had presented to the ED on 01/18/2023 for fever of 101.9 and tachycardia.  Reported at that time the drainage was draining milky discharge.  Fluid collected from the JP drain grew Staphylococcus aureus and Pseudomonas pansensitive on 01/18/2023. She subsequently had additional cultures intraoperatively which grew Staphylococcus aureus, pansensitive.  Patient was subsequently discharged from the hospital on 01/26/2023 and was doing well at that time.  Patient presents today with her husband.  She reports overall she has been doing much better after having the expander removed.  She reports she is currently taking p.o. antibiotics prescribed at the hospital.  She had a total of 10-day prescription, so she has 6 more days.  She is not having any infectious symptoms today.  She does report that she is having some spasms in her right pectoralis muscle, but reports she has not tried the Valium yet.  She was unsure if Valium would be helpful for muscle spasms or not so she did not want to take it.  Chaperone present on exam She is well-appearing, no acute distress, breathing is unlabored. On exam right breast JP drain is in place, bloody drainage is noticed in the bulb.  It is approximately 5 cc at this time.  Right breast incision is intact and appears to be healing well.  She does have some mild erythema and inflammation surrounding the incision, but no cellulitic changes.  There is some edema in the mastectomy flaps.  I do not appreciate any significant tenderness with palpation, no crepitus with palpation.  There is no subcutaneous fluid collections noted.  A/P:  Will plan to leave JP drain in place for at least 1 more week given the positive cultures and residual  erythema present.  Also would like to see the drainage color lightened up prior to removal.  Discussed this with the patient.  She was comfortable and agreeable to this.  I discussed with her that she should use the Valium for the muscle spasms as this is typically very helpful to help decrease pain.  She is encouraged to continue with antibiotics prescribed by general surgery prior to discharge.  There is no signs of active infection on exam, she is well-appearing and we will follow-up next week.

## 2023-02-04 ENCOUNTER — Ambulatory Visit (INDEPENDENT_AMBULATORY_CARE_PROVIDER_SITE_OTHER): Payer: Medicaid Other | Admitting: Physician Assistant

## 2023-02-04 ENCOUNTER — Telehealth: Payer: Self-pay | Admitting: Physician Assistant

## 2023-02-04 VITALS — BP 119/75 | HR 71

## 2023-02-04 DIAGNOSIS — C50911 Malignant neoplasm of unspecified site of right female breast: Secondary | ICD-10-CM

## 2023-02-04 DIAGNOSIS — Z9889 Other specified postprocedural states: Secondary | ICD-10-CM

## 2023-02-04 NOTE — Telephone Encounter (Signed)
Pt called and forgot to ask during her visit when she can resume regular activity or if she still has restrictions.  Please call her at [phone]

## 2023-02-04 NOTE — Progress Notes (Signed)
This is a 57 year old female seen in our office for follow-up evaluation status post removal of right breast tissue expander by Dr. Ulice Bold on 01/23/2023 after right breast mastectomy and immediate breast reconstruction by Dr. Tonna Boehringer and Dr. Ulice Bold on 01/01/2023.  The patient notes that since her last office visit on 01/30/2023 she has been doing better.  She denies any infectious signs or symptoms.  The pain has been tolerable.  She notes very minimal JP drainage noting approximately 3 cc total out of the right JP drain.  Chaperone present.  Incision is clean dry and intact with Dermabond over top.  Mastectomy flaps are viable, no overlying redness, no palpable fluid collections.  JP drain with minimal serosanguineous output.  Overall the patient is doing well postoperatively.  Her wounds are healing as expected.  She is pretty adamant that she does not pursue any further surgical intervention.  I do recommend that the patient continue her routine follow-up evaluation in July, she needs to reach out to our office if she develops any new or worsening signs or symptoms in the meantime.  I remove the JP drain without difficulty.  She tolerated this well.  The patient verbalized understanding and agreement to today's plan.

## 2023-02-06 NOTE — Telephone Encounter (Signed)
I spoke with her

## 2023-02-09 ENCOUNTER — Encounter: Payer: Self-pay | Admitting: *Deleted

## 2023-02-09 DIAGNOSIS — E063 Autoimmune thyroiditis: Secondary | ICD-10-CM | POA: Diagnosis not present

## 2023-02-09 DIAGNOSIS — E559 Vitamin D deficiency, unspecified: Secondary | ICD-10-CM | POA: Diagnosis not present

## 2023-02-09 DIAGNOSIS — E1169 Type 2 diabetes mellitus with other specified complication: Secondary | ICD-10-CM | POA: Diagnosis not present

## 2023-02-09 DIAGNOSIS — E210 Primary hyperparathyroidism: Secondary | ICD-10-CM | POA: Diagnosis not present

## 2023-02-09 DIAGNOSIS — E785 Hyperlipidemia, unspecified: Secondary | ICD-10-CM | POA: Diagnosis not present

## 2023-02-11 ENCOUNTER — Inpatient Hospital Stay: Payer: 59 | Admitting: Occupational Therapy

## 2023-02-16 ENCOUNTER — Encounter: Payer: 59 | Attending: Surgery | Admitting: Dietician

## 2023-02-16 ENCOUNTER — Encounter: Payer: Self-pay | Admitting: Dietician

## 2023-02-16 DIAGNOSIS — E1159 Type 2 diabetes mellitus with other circulatory complications: Secondary | ICD-10-CM | POA: Diagnosis not present

## 2023-02-16 DIAGNOSIS — E1169 Type 2 diabetes mellitus with other specified complication: Secondary | ICD-10-CM | POA: Diagnosis not present

## 2023-02-16 DIAGNOSIS — E1165 Type 2 diabetes mellitus with hyperglycemia: Secondary | ICD-10-CM | POA: Insufficient documentation

## 2023-02-16 DIAGNOSIS — E785 Hyperlipidemia, unspecified: Secondary | ICD-10-CM | POA: Diagnosis not present

## 2023-02-16 DIAGNOSIS — Z7984 Long term (current) use of oral hypoglycemic drugs: Secondary | ICD-10-CM | POA: Insufficient documentation

## 2023-02-16 DIAGNOSIS — E063 Autoimmune thyroiditis: Secondary | ICD-10-CM | POA: Diagnosis not present

## 2023-02-16 NOTE — Patient Instructions (Addendum)
Keep up the good work eating three meals a day, about 5-6 hours apart!  Begin to recognize carbohydrates, proteins, and non-starchy vegetables in your food choices!  Begin to build your meals using the proportions of the Balanced Plate. First, select your carb choice(s) for the meal. Make this 25% of your meal. Next, select your source of protein to pair with your carb choice(s). Make this another 25% of your meal. Finally, complete your meal with a variety of non-starchy vegetables. Make this the remaining 50% of your meal.  Check your blood sugar each morning before eating or drinking (fasting). Look for numbers under 130 mg/dL Check your blood sugar 2 hours after you begin eating a meal. Look for numbers under 180 mg/dL at all times.  Your goal time in range % (70 - 180)  is greater than 70%  Your goal A1c is below 7.0%  Experiment with different foods and activity after meals and see how your blood sugar reacts on your Libre App!!  Walk 15 minutes on your treadmill after breakfast and dinner meals!!

## 2023-02-16 NOTE — Progress Notes (Signed)
Diabetes Self-Management Education  Visit Type: First/Initial  Appt. Start Time: 1530 Appt. End Time: 1640  02/16/2023  [name], identified by name and date of birth, is a 57 y.o. female with a diagnosis of Diabetes: Type 2.   ASSESSMENT  There were no vitals taken for this visit. There is no height or weight on file to calculate BMI. Pt reports taking metformin every day with breakfast, no GI side effects noted. Pt reports having a prescription for Ozempic, but insurance is not covering it. Pt reports previously taking Trulicity until insurance stopped covering it. Pt reports they still want to eat whatever they want, doesn't want dietary restrictions. Pt reports wearing Freestyle Libre 3, received 3 samples from hospital, one defective, currently wearing first sample for last 4 days. Pt states it is helpful. CGM Report reveals TIR at 64%, no lows, 16% Very high, daily pattern reveals consistently very high glucose after breakfast (as high as 300 mg/dL), pt states they like to eat pancakes for breakfast. Pt reports being hospitalized for an infection of mastectomy sight, pt reports they were septic, this is when A1c of 9.9% was taken. Pt reports history of walking on treadmill for 30 minutes a day, has not been doing it recently.   Diabetes Self-Management Education - 02/16/23 1536       Visit Information   Visit Type First/Initial      Initial Visit   Diabetes Type Type 2    Date Diagnosed 2019    Are you currently following a meal plan? No    Are you taking your medications as prescribed? Yes      Health Coping   How would you rate your overall health? Good      Psychosocial Assessment   Patient Belief/Attitude about Diabetes Denial    What is the hardest part about your diabetes right now, causing you the most concern, or is the most worrisome to you about your diabetes?   Making healty food and beverage choices;Getting support / problem solving    Self-care  barriers Other (comment)   Denial   Self-management support Doctor's office    Other persons present Patient    Patient Concerns Nutrition/Meal planning;Glycemic Control    Special Needs None    Preferred Learning Style No preference indicated    Learning Readiness Contemplating    How often do you need to have someone help you when you read instructions, pamphlets, or other written materials from your doctor or pharmacy? 1 - Never    What is the last grade level you completed in school? Associates degree      Pre-Education Assessment   Patient understands the diabetes disease and treatment process. Needs Review    Patient understands incorporating nutritional management into lifestyle. Needs Review    Patient undertands incorporating physical activity into lifestyle. Needs Review    Patient understands using medications safely. Needs Review    Patient understands monitoring blood glucose, interpreting and using results Needs Review    Patient understands prevention, detection, and treatment of acute complications. Needs Review    Patient understands prevention, detection, and treatment of chronic complications. Needs Review    Patient understands how to develop strategies to address psychosocial issues. Needs Review    Patient understands how to develop strategies to promote health/change behavior. Needs Review      Complications   Last HgB A1C per patient/outside source 9.9 %   01/18/2023   How often do you check your blood sugar? >  4 times/day   Using sample CGM   Fasting Blood glucose range (mg/dL) 161-096    Postprandial Blood glucose range (mg/dL) >045    Number of hypoglycemic episodes per month 0    Number of hyperglycemic episodes ( >200mg /dL): Daily    Can you tell when your blood sugar is high? Yes    What do you do if your blood sugar is high? Drink water, walk    Have you had a dilated eye exam in the past 12 months? Yes    Have you had a dental exam in the past 12 months?  Yes    Are you checking your feet? No      Activity / Exercise   Activity / Exercise Type ADL's    How many days per week do you exercise? 0    How many minutes per day do you exercise? 0    Total minutes per week of exercise 0      Patient Education   Previous Diabetes Education Yes (please comment)   In hospital   Disease Pathophysiology Factors that contribute to the development of diabetes;Explored patient's options for treatment of their diabetes    Healthy Eating Role of diet in the treatment of diabetes and the relationship between the three main macronutrients and blood glucose level;Plate Method    Being Active Role of exercise on diabetes management, blood pressure control and cardiac health.;Helped patient identify appropriate exercises in relation to his/her diabetes, diabetes complications and other health issue.    Medications Reviewed patients medication for diabetes, action, purpose, timing of dose and side effects.    Monitoring Taught/evaluated CGM (comment);Identified appropriate SMBG and/or A1C goals.    Acute complications Discussed and identified patients' prevention, symptoms, and treatment of hyperglycemia.    Chronic complications Relationship between chronic complications and blood glucose control;Retinopathy and reason for yearly dilated eye exams;Nephropathy, what it is, prevention of, the use of ACE, ARB's and early detection of through urine microalbumia.    Diabetes Stress and Support Role of stress on diabetes;Identified and addressed patients feelings and concerns about diabetes    Lifestyle and Health Coping Lifestyle issues that need to be addressed for better diabetes care      Individualized Goals (developed by patient)   Nutrition Follow meal plan discussed    Physical Activity 30 minutes per day    Medications take my medication as prescribed    Monitoring  Consistenly use CGM    Problem Solving Eating Pattern      Post-Education Assessment    Patient understands the diabetes disease and treatment process. Comprehends key points    Patient understands incorporating nutritional management into lifestyle. Comprehends key points    Patient undertands incorporating physical activity into lifestyle. Comprehends key points    Patient understands using medications safely. Demonstrates understanding / competency    Patient understands monitoring blood glucose, interpreting and using results Comprehends key points    Patient understands prevention, detection, and treatment of acute complications. Demonstrates understanding / competency    Patient understands prevention, detection, and treatment of chronic complications. Comprehends key points    Patient understands how to develop strategies to address psychosocial issues. Comprehends key points    Patient understands how to develop strategies to promote health/change behavior. Comprehends key points      Outcomes   Expected Outcomes Demonstrated interest in learning. Expect positive outcomes    Future DMSE 2 months    Program Status Not Completed  Individualized Plan for Diabetes Self-Management Training:   Learning Objective:  Patient will have a greater understanding of diabetes self-management. Patient education plan is to attend individual and/or group sessions per assessed needs and concerns.   Plan:   Patient Instructions  Keep up the good work eating three meals a day, about 5-6 hours apart!  Begin to recognize carbohydrates, proteins, and non-starchy vegetables in your food choices!  Begin to build your meals using the proportions of the Balanced Plate. First, select your carb choice(s) for the meal. Make this 25% of your meal. Next, select your source of protein to pair with your carb choice(s). Make this another 25% of your meal. Finally, complete your meal with a variety of non-starchy vegetables. Make this the remaining 50% of your meal.  Check your  blood sugar each morning before eating or drinking (fasting). Look for numbers under 130 mg/dL Check your blood sugar 2 hours after you begin eating a meal. Look for numbers under 180 mg/dL at all times.  Your goal time in range % (70 - 180)  is greater than 70%  Your goal A1c is below 7.0%  Experiment with different foods and activity after meals and see how your blood sugar reacts on your Libre App!!  Walk 15 minutes on your treadmill after breakfast and dinner meals!!     Expected Outcomes:  Demonstrated interest in learning. Expect positive outcomes  Education material provided: My Plate, Carbs food list, protein Food list  If problems or questions, patient to contact team via:  Phone and Email  Future DSME appointment: 2 months

## 2023-02-25 NOTE — Progress Notes (Signed)
Patient is a pleasant 57 year old female with PMH of right-sided mastectomy and reconstruction now s/p removal of the right breast tissue expander performed 01/23/2023 by Dr. Ulice Bold due to infection who returns to clinic for postoperative follow-up.  She was last seen here in clinic on 02/04/2023.  At that time, exam was entirely benign.  Follow-up as scheduled.  She verbalized that she was not interested in any additional surgical reconstruction, but would continue weighing her options.  Today, patient is doing well from a postoperative recovery standpoint.  She denies any pain, swelling, drainage, or other concerns.  She does state that there is some overlying dark scabbing, but it has not been bothersome.  She has not been applying Vaseline or other ointments.  She has been gently massaging the area periodically.  She is still not interested in any further reconstructive efforts and is perfectly happy note the cancer is gone.  She has already scheduled another visit with [facility] to nature to discuss prosthetic bras.  On exam, residual Dermabond is gently removed with pickups.  Incisions CDI throughout.  Scattered sutures removed.  No palpable fluid collections.  Some contour irregularities due to scarring.  No erythema or other overlying skin changes.  Recommend regular massage of the area daily as well as Vaseline 1-2 times daily to help with any residual Dermabond.  Discussed silicone scar gels.  She denies any leg swelling, chest pain, or difficulty breathing, but does report that periodically she will experience episodes of heart fluttering.  She denies any associated lightheadedness or other symptoms during these episodes.  Recommended that she follow-up with her primary care provider for ongoing evaluation and management.    Follow-up only as needed.  Picture(s) obtained of the patient and placed in the chart were with the patient's or guardian's permission.

## 2023-02-27 ENCOUNTER — Ambulatory Visit (INDEPENDENT_AMBULATORY_CARE_PROVIDER_SITE_OTHER): Payer: 59 | Admitting: Physician Assistant

## 2023-02-27 DIAGNOSIS — C50911 Malignant neoplasm of unspecified site of right female breast: Secondary | ICD-10-CM

## 2023-02-27 DIAGNOSIS — Z9889 Other specified postprocedural states: Secondary | ICD-10-CM

## 2023-02-27 DIAGNOSIS — Z719 Counseling, unspecified: Secondary | ICD-10-CM

## 2023-03-04 ENCOUNTER — Inpatient Hospital Stay: Payer: 59 | Attending: Oncology | Admitting: Occupational Therapy

## 2023-03-04 DIAGNOSIS — M25611 Stiffness of right shoulder, not elsewhere classified: Secondary | ICD-10-CM
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
## 2023-03-05 DIAGNOSIS — C50911 Malignant neoplasm of unspecified site of right female breast: Secondary | ICD-10-CM | POA: Diagnosis not present

## 2023-03-10 DIAGNOSIS — C50911 Malignant neoplasm of unspecified site of right female breast: Secondary | ICD-10-CM | POA: Diagnosis not present

## 2023-03-10 DIAGNOSIS — C50311 Malignant neoplasm of lower-inner quadrant of right female breast: Secondary | ICD-10-CM | POA: Diagnosis not present

## 2023-03-10 DIAGNOSIS — Z4802 Encounter for removal of sutures: Secondary | ICD-10-CM | POA: Diagnosis not present

## 2023-03-11 ENCOUNTER — Telehealth: Payer: Self-pay | Admitting: *Deleted

## 2023-03-11 NOTE — Telephone Encounter (Signed)
Received on (03/06/23) via of fax DME Standard Written Order from [employer] [facility] [place].  Requesting signature and return.  Given to provider to sign and return.  DME Standard Written Order signed and faxed back to [facility] [place].  Confirmation received and copy scanned into the chart.//AB/CMA

## 2023-03-12 DIAGNOSIS — C50911 Malignant neoplasm of unspecified site of right female breast: Secondary | ICD-10-CM | POA: Diagnosis not present

## 2023-03-19 ENCOUNTER — Other Ambulatory Visit: Payer: 59

## 2023-03-20 DIAGNOSIS — M25522 Pain in left elbow: Secondary | ICD-10-CM | POA: Diagnosis not present

## 2023-03-26 ENCOUNTER — Other Ambulatory Visit: Payer: Medicaid Other

## 2023-03-26 ENCOUNTER — Other Ambulatory Visit (LOCAL_COMMUNITY_HEALTH_CENTER): Payer: Self-pay

## 2023-03-26 DIAGNOSIS — Z111 Encounter for screening for respiratory tuberculosis: Secondary | ICD-10-CM

## 2023-03-26 NOTE — Progress Notes (Signed)
In nurse clinic for QFT. ROI signed. RN explained to patient that [facility] will call when results are available. Patient does have MyChart account and RN explained she is able to see results there. Patient states she will need copy of results; RN explained [facility] can supply her with results. All questions answered and verbalizes understanding. Patient walked to lab. Abagail Kitchens, RN

## 2023-03-30 ENCOUNTER — Encounter: Payer: Self-pay | Admitting: Oncology

## 2023-03-30 ENCOUNTER — Inpatient Hospital Stay (HOSPITAL_BASED_OUTPATIENT_CLINIC_OR_DEPARTMENT_OTHER): Payer: 59 | Admitting: Oncology

## 2023-03-30 ENCOUNTER — Encounter: Payer: Self-pay | Admitting: *Deleted

## 2023-03-30 ENCOUNTER — Inpatient Hospital Stay: Payer: 59 | Attending: Oncology

## 2023-03-30 VITALS — BP 129/89 | HR 76 | Temp 96.9°F | Resp 18 | Wt 184.1 lb

## 2023-03-30 DIAGNOSIS — Z171 Estrogen receptor negative status [ER-]: Secondary | ICD-10-CM | POA: Insufficient documentation

## 2023-03-30 DIAGNOSIS — Z79811 Long term (current) use of aromatase inhibitors: Secondary | ICD-10-CM | POA: Insufficient documentation

## 2023-03-30 DIAGNOSIS — Z9011 Acquired absence of right breast and nipple: Secondary | ICD-10-CM | POA: Insufficient documentation

## 2023-03-30 DIAGNOSIS — C50211 Malignant neoplasm of upper-inner quadrant of right female breast: Secondary | ICD-10-CM | POA: Insufficient documentation

## 2023-03-30 DIAGNOSIS — C50919 Malignant neoplasm of unspecified site of unspecified female breast: Secondary | ICD-10-CM

## 2023-03-30 DIAGNOSIS — Z801 Family history of malignant neoplasm of trachea, bronchus and lung: Secondary | ICD-10-CM | POA: Insufficient documentation

## 2023-03-30 DIAGNOSIS — C50911 Malignant neoplasm of unspecified site of right female breast: Secondary | ICD-10-CM | POA: Diagnosis not present

## 2023-03-30 DIAGNOSIS — R748 Abnormal levels of other serum enzymes: Secondary | ICD-10-CM | POA: Insufficient documentation

## 2023-03-30 DIAGNOSIS — E210 Primary hyperparathyroidism: Secondary | ICD-10-CM | POA: Insufficient documentation

## 2023-03-30 LAB — CMP (CANCER CENTER ONLY)
ALT: 49 U/L — ABNORMAL HIGH (ref 0–44)
AST: 29 U/L (ref 15–41)
Albumin: 4 g/dL (ref 3.5–5.0)
Alkaline Phosphatase: 121 U/L (ref 38–126)
Anion gap: 9 (ref 5–15)
BUN: 11 mg/dL (ref 6–20)
CO2: 26 mmol/L (ref 22–32)
Calcium: 9.8 mg/dL (ref 8.9–10.3)
Chloride: 104 mmol/L (ref 98–111)
Creatinine: 0.85 mg/dL (ref 0.44–1.00)
GFR, Estimated: >60 mL/min (ref >60)
Glucose, Bld: 197 mg/dL — ABNORMAL HIGH (ref 70–99)
Potassium: 4.3 mmol/L (ref 3.5–5.1)
Sodium: 139 mmol/L (ref 135–145)
Total Bilirubin: 0.4 mg/dL (ref 0.3–1.2)
Total Protein: 7.2 g/dL (ref 6.5–8.1)

## 2023-03-30 LAB — CBC WITH DIFFERENTIAL (CANCER CENTER ONLY)
Abs Immature Granulocytes: 0.02 10*3/uL (ref 0.00–0.07)
Basophils Absolute: 0 10*3/uL (ref 0.0–0.1)
Basophils Relative: 1 %
Eosinophils Absolute: 0.2 10*3/uL (ref 0.0–0.5)
Eosinophils Relative: 3 %
HCT: 37.7 % (ref 36.0–46.0)
Hemoglobin: 11.2 g/dL — ABNORMAL LOW (ref 12.0–15.0)
Immature Granulocytes: 0 %
Lymphocytes Relative: 43 %
Lymphs Abs: 2.5 10*3/uL (ref 0.7–4.0)
MCH: 24.4 pg — ABNORMAL LOW (ref 26.0–34.0)
MCHC: 29.7 g/dL — ABNORMAL LOW (ref 30.0–36.0)
MCV: 82.1 fL (ref 80.0–100.0)
Monocytes Absolute: 0.5 10*3/uL (ref 0.1–1.0)
Monocytes Relative: 8 %
Neutro Abs: 2.7 10*3/uL (ref 1.7–7.7)
Neutrophils Relative %: 45 %
Platelet Count: 344 10*3/uL (ref 150–400)
RBC: 4.59 MIL/uL (ref 3.87–5.11)
RDW: 14.1 % (ref 11.5–15.5)
WBC Count: 5.9 10*3/uL (ref 4.0–10.5)
nRBC: 0 % (ref 0.0–0.2)

## 2023-03-30 NOTE — Assessment & Plan Note (Addendum)
Recommend  vitamin D supplementation.  Obtain DEXA Per endocrinology, she is not able to take calcium supplementation due to history of hyper calcium due to primary hyperparathyroidism.
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
## 2023-03-30 NOTE — Assessment & Plan Note (Addendum)
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  Oncotype DX score is 10, no chemotherapy benefit. No need for adjuvant radiation as she has had a mastectomy. Recommend adjuvant endocrine therapy She tolerates Arimidex 1mg  daily.  Continue current regimen.  Screening mammogram left breast unilateral-March 2025. Obtain baseline DEXA.

## 2023-03-30 NOTE — Assessment & Plan Note (Signed)
Resolved

## 2023-03-31 DIAGNOSIS — M7712 Lateral epicondylitis, left elbow: Secondary | ICD-10-CM | POA: Diagnosis not present

## 2023-04-22 ENCOUNTER — Encounter: Payer: Self-pay | Admitting: Plastic Surgery

## 2023-04-28 ENCOUNTER — Encounter: Payer: Self-pay | Admitting: Physician Assistant

## 2023-04-28 ENCOUNTER — Ambulatory Visit (INDEPENDENT_AMBULATORY_CARE_PROVIDER_SITE_OTHER): Payer: 59 | Admitting: Student

## 2023-04-28 VITALS — BP 110/76 | HR 87

## 2023-04-28 DIAGNOSIS — C50911 Malignant neoplasm of unspecified site of right female breast: Secondary | ICD-10-CM

## 2023-04-28 DIAGNOSIS — D0511 Intraductal carcinoma in situ of right breast: Secondary | ICD-10-CM | POA: Diagnosis not present
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
## 2023-04-28 NOTE — Progress Notes (Signed)
Referring Provider Marguarite Arbour, MD [address] [facility] [place],  [place] [address]   CC:  Chief Complaint  Patient presents with   Follow-up      Tina Payne is an 57 y.o. female.  HPI: Patient is a pleasant 57 year old female with PMH of right-sided mastectomy and attempted unilateral reconstruction complicated by infection with subsequent removal of right-sided breast tissue expander performed 01/23/2023 by Dr. Ulice Bold who presents to clinic for follow-up.  She was last seen here in clinic on 02/27/2023.  At that time, she is doing well from a postoperative standpoint and denying any pain or other concerns.  She was not interested in any further reconstructive efforts and had already scheduled another visit with second to nature to discuss prosthetic bras.  Exam was entirely benign.  Some contour irregularities noted however due to scarring.  Recommended regular mechanical massage of the area daily as well as Vaseline to help with any residual Dermabond.  Follow-up only as needed.  Today, patient reports she is doing well.  She states that recently, she noticed pain to her right axilla.  She states that she has not really been taking any medications for this.  She states that the pain has felt like a sharp shooting pain.  She states that she also has felt this pain near the lateral aspect of her incision.  Patient also reports that she has a mole that has been changing in size over her right axilla.  She denies any fevers or chills.  She denies any drainage.  Patient does state that she had lymph node dissection to the right axilla.  Review of Systems General: Denies any fevers or chills  Physical Exam    04/28/2023    2:36 PM 03/30/2023   10:24 AM 02/04/2023    9:54 AM  Vitals with BMI  Weight  184 lbs 2 oz   Systolic 110 129 604  Diastolic 76 89 75  Pulse 87 76 71    General:  No acute distress,  Alert and oriented, Non-Toxic, Normal speech and  affect Chaperone present on exam.  On exam, patient is sitting upright in no acute distress.  To the right breast, there appears to be some skin that is slightly scarred down to the chest wall centrally.  There is no overlying erythema.  Incision appears to be well-healed.  There are no obvious fluid collections noted on exam.  Mild tenderness to palpation to the right axillary region.  Small skin lesion that appears to be consistent with skin tag noted over the right axillary region.  No signs of infection on exam.  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  I believe that physical therapy would benefit her pain and would help with the scarring down of her skin to her breast.  Patient was in agreement with going to physical therapy.  Referral was placed.  I also discussed with patient that given she is having some scarring down of the skin, I would like her to continue light range of motion and massaging of the area.  Discussed with her that the pain she is also experiencing is most likely some nerve pain.  Discussed with her that this should improve with time, but should continue to monitor.  Patient expressed understanding.  I also discussed with patient that she may take Tylenol and ibuprofen if she would like for pain.  Patient expressed understanding.  In terms of her skin lesion, we will set the patient up for removal with Dr. Ulice Bold.  Patient expressed understanding and was in agreement to this.  I did discuss this also with Dr. Ulice Bold and she was in agreement.  We will have the patient follow back up in 1 month for reevaluation.  I discussed with the patient would like her to call in the meantime if she has any questions or concerns about anything.  Pictures were obtained  of the patient and placed in the chart with the patient's or guardian's permission.   Laurena Spies 04/28/2023, 3:21 PM

## 2023-05-05 ENCOUNTER — Ambulatory Visit: Payer: 59 | Attending: Student

## 2023-05-05 DIAGNOSIS — C50911 Malignant neoplasm of unspecified site of right female breast: Secondary | ICD-10-CM | POA: Insufficient documentation

## 2023-05-05 NOTE — Therapy (Signed)
Pt seen for evaluation and noted: I can't come back after today. I live 45 min away. She had previously seen OT Marisue Humble and said she would be happier to go back to [place] . Messaged referring MD and requested a referral be sent for OT to [facility] at [place]. Pt was educated to try the 4 post op exercises for the tightness she is feeling, and to wear a compression bra/cami for swelling. Pt. Was given the number for OT at [facility] and a handout with post op exercises.

## 2023-05-06 ENCOUNTER — Other Ambulatory Visit: Payer: Self-pay | Admitting: Student

## 2023-05-06 DIAGNOSIS — C50911 Malignant neoplasm of unspecified site of right female breast: Secondary | ICD-10-CM

## 2023-05-06 NOTE — Progress Notes (Signed)
Patient evaluated by physical therapy. Physical therapist reached out to me requesting OT referral to [place] as it is closer for the patient and patient was requesting it.

## 2023-05-07 ENCOUNTER — Other Ambulatory Visit: Payer: Self-pay

## 2023-05-07 ENCOUNTER — Encounter: Payer: Self-pay | Admitting: Oncology

## 2023-05-07 MED ORDER — ANASTROZOLE 1 MG PO TABS: 1.0000 mg | ORAL_TABLET | Freq: Every day | ORAL | 2 refills | Status: DC

## 2023-05-14 DIAGNOSIS — E1169 Type 2 diabetes mellitus with other specified complication: Secondary | ICD-10-CM | POA: Diagnosis not present

## 2023-05-14 DIAGNOSIS — E785 Hyperlipidemia, unspecified: Secondary | ICD-10-CM | POA: Diagnosis not present

## 2023-05-19 ENCOUNTER — Ambulatory Visit: Payer: 59 | Admitting: Dietician

## 2023-05-21 DIAGNOSIS — E210 Primary hyperparathyroidism: Secondary | ICD-10-CM | POA: Diagnosis not present

## 2023-05-21 DIAGNOSIS — E785 Hyperlipidemia, unspecified: Secondary | ICD-10-CM | POA: Diagnosis not present

## 2023-05-21 DIAGNOSIS — E1159 Type 2 diabetes mellitus with other circulatory complications: Secondary | ICD-10-CM | POA: Diagnosis not present

## 2023-05-21 DIAGNOSIS — E782 Mixed hyperlipidemia: Secondary | ICD-10-CM | POA: Diagnosis not present

## 2023-05-21 DIAGNOSIS — E1169 Type 2 diabetes mellitus with other specified complication: Secondary | ICD-10-CM | POA: Diagnosis not present

## 2023-05-21 DIAGNOSIS — E063 Autoimmune thyroiditis: Secondary | ICD-10-CM | POA: Diagnosis not present

## 2023-06-01 ENCOUNTER — Ambulatory Visit: Payer: 59 | Admitting: Student

## 2023-06-10 ENCOUNTER — Ambulatory Visit
Admission: RE | Admit: 2023-06-10 | Discharge: 2023-06-10 | Disposition: A | Discharge status: Home / Self Care | Payer: 59 | Source: Ambulatory Visit | Attending: Oncology | Admitting: Oncology

## 2023-06-10 ENCOUNTER — Ambulatory Visit (LOCAL_COMMUNITY_HEALTH_CENTER): Payer: Medicaid Other

## 2023-06-10 DIAGNOSIS — Z111 Encounter for screening for respiratory tuberculosis: Secondary | ICD-10-CM

## 2023-06-10 DIAGNOSIS — C50919 Malignant neoplasm of unspecified site of unspecified female breast: Secondary | ICD-10-CM | POA: Diagnosis not present

## 2023-06-10 DIAGNOSIS — Z780 Asymptomatic menopausal state: Secondary | ICD-10-CM | POA: Diagnosis not present

## 2023-06-10 NOTE — Progress Notes (Signed)
In nurse clinic for QFT lab. ROI signed from 03/26/2023. RN notified patient that [facility] will call patient once results are available. All questions answered and verbalizes understanding.   Abagail Kitchens, RN

## 2023-06-13 LAB — QUANTIFERON-TB GOLD PLUS
QuantiFERON Mitogen Value: >10 [IU]/mL
QuantiFERON Nil Value: 0.01 [IU]/mL
QuantiFERON TB1 Ag Value: 0.01 [IU]/mL
QuantiFERON TB2 Ag Value: 0.01 [IU]/mL
QuantiFERON-TB Gold Plus: NEGATIVE

## 2023-06-15 NOTE — Progress Notes (Signed)
Reviewed. QFT negative. Phone call to patient and informed of QFT negative results. Patient plans to pick up paper result copy along with [facility] immunization copy at [facility] info booth sometime Monday- Friday between 8 am to 5 pm. Questions answered and reports understanding. Jerel Shepherd, RN

## 2023-07-07 ENCOUNTER — Other Ambulatory Visit: Payer: Self-pay | Admitting: Physician Assistant

## 2023-07-07 ENCOUNTER — Other Ambulatory Visit: Payer: Self-pay | Admitting: Oncology

## 2023-07-21 IMAGING — MG MM DIGITAL SCREENING BILAT W/ TOMO AND CAD
8 series · 8 of 24 positions shown · non-contrast
Comparison: Previous exam(s).

CLINICAL DATA: Screening.

EXAM:
DIGITAL SCREENING BILATERAL MAMMOGRAM WITH TOMOSYNTHESIS AND CAD
TECHNIQUE: Bilateral screening digital craniocaudal and mediolateral oblique
mammograms were obtained. Bilateral screening digital breast
tomosynthesis was performed. The images were evaluated with
computer-aided detection.

[L MLO synth-2D]
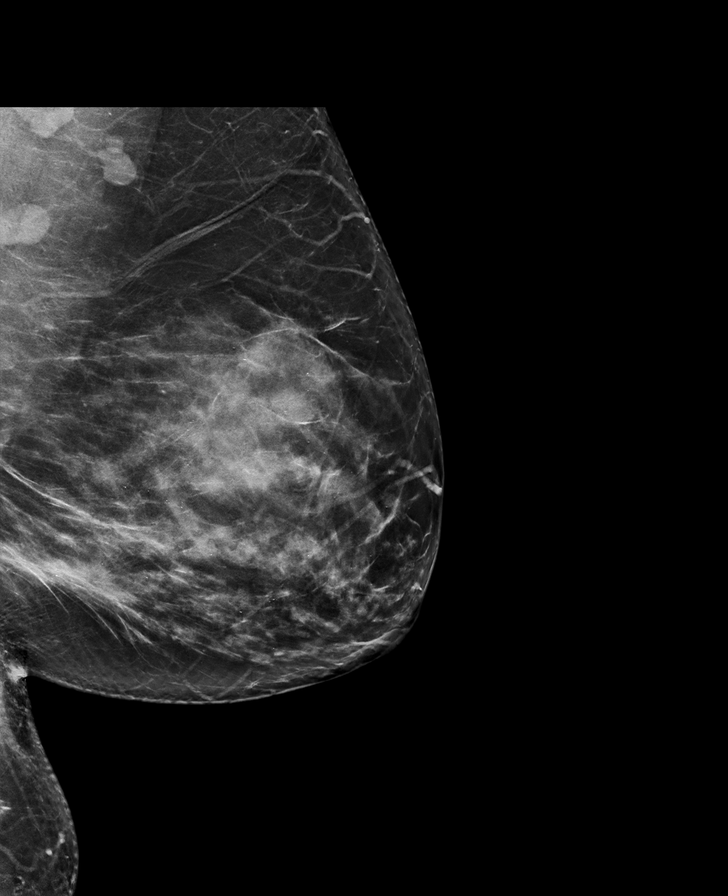

[R MLO synth-2D]
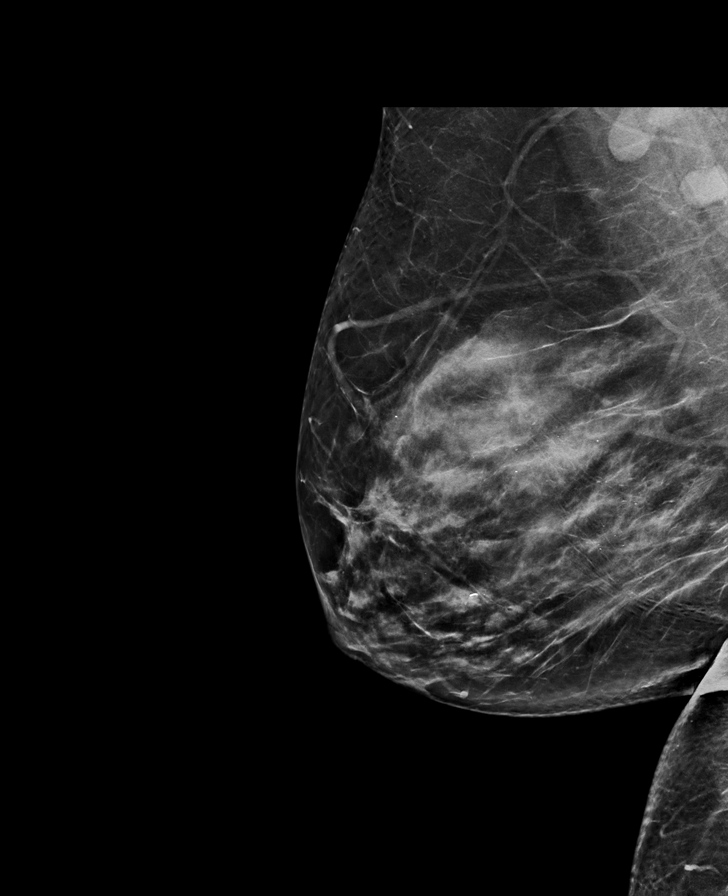

[L CC synth-2D]
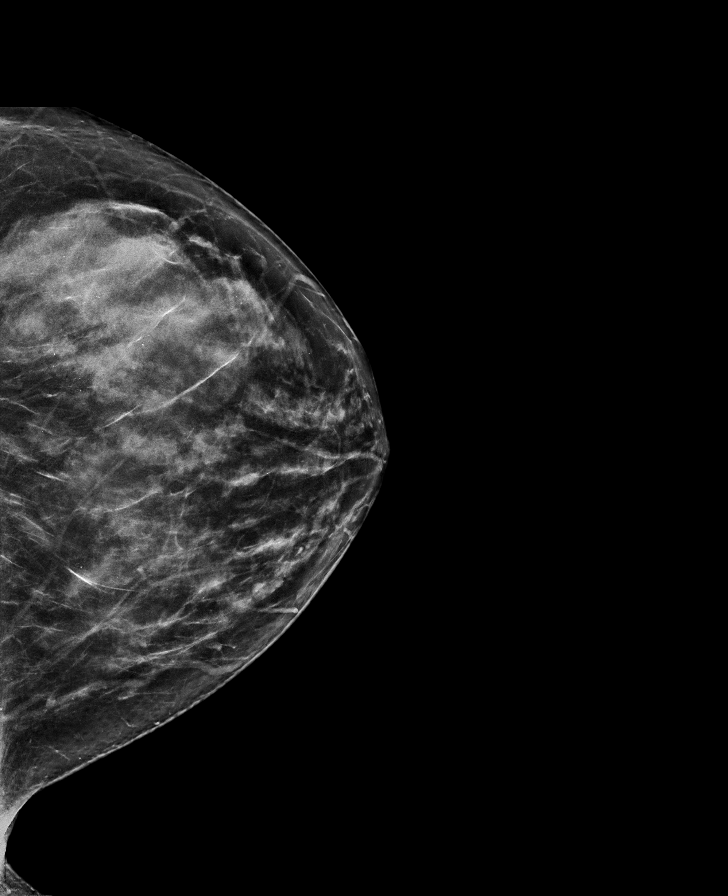

[R CC synth-2D]
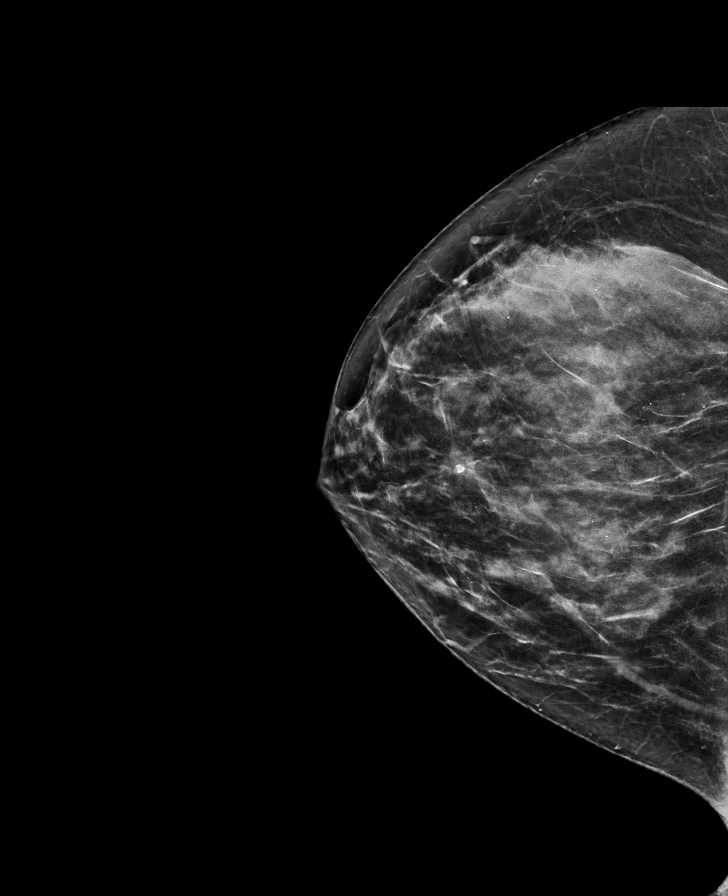

[R MLO tomo · tomo slice 39/78.0]
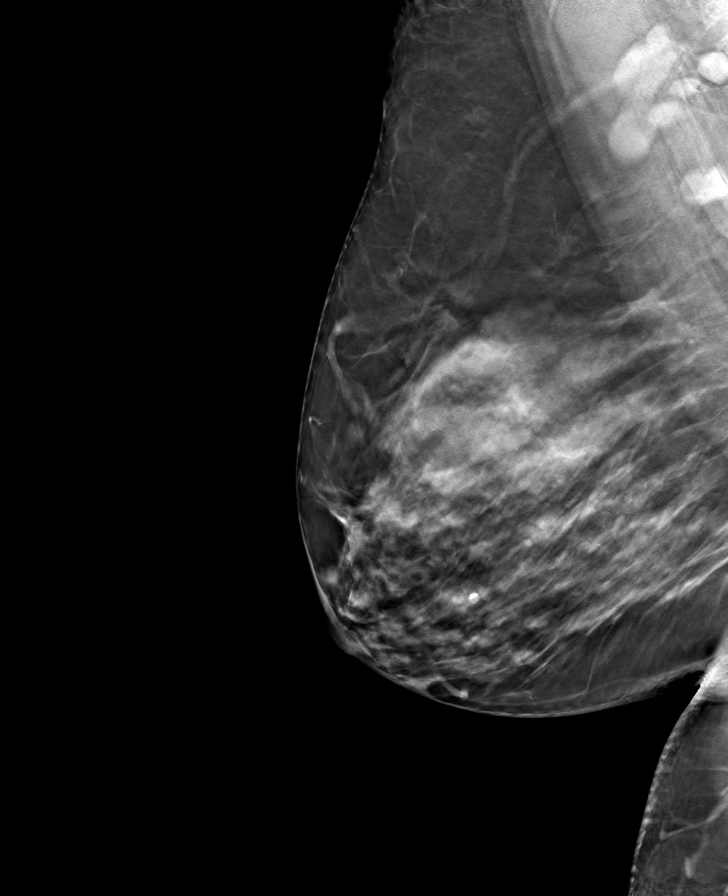

[L CC tomo · tomo slice 35/69.0]
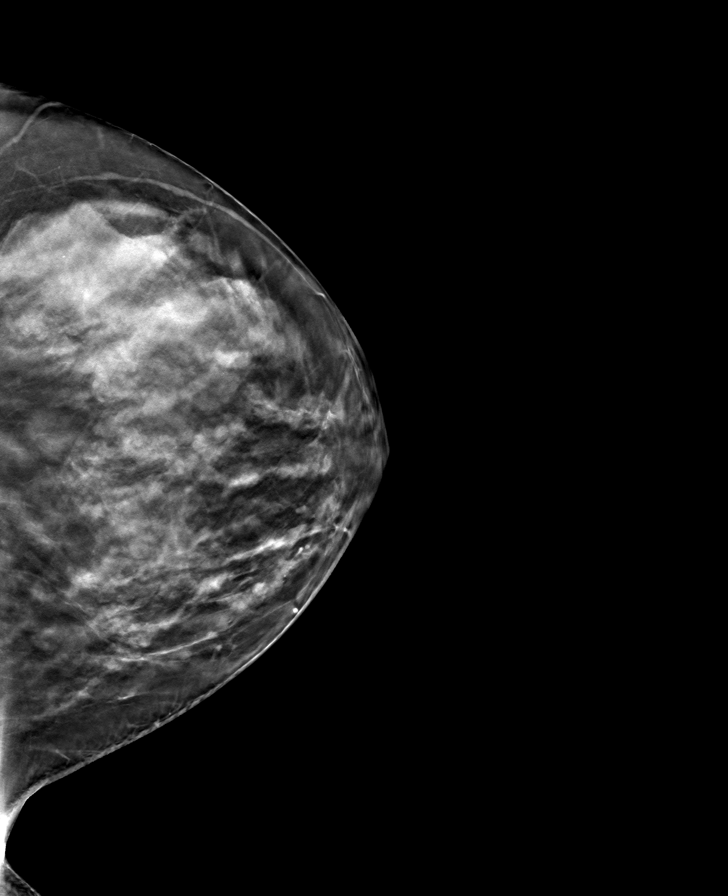

[R CC tomo · tomo slice 35/69.0]
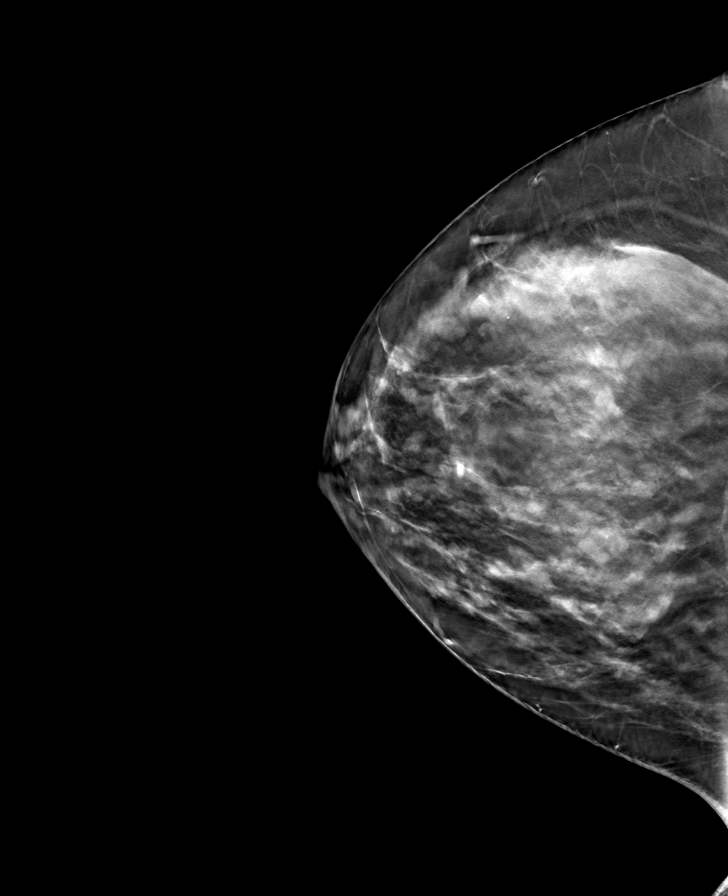

[L MLO tomo · tomo slice 41/80.0]
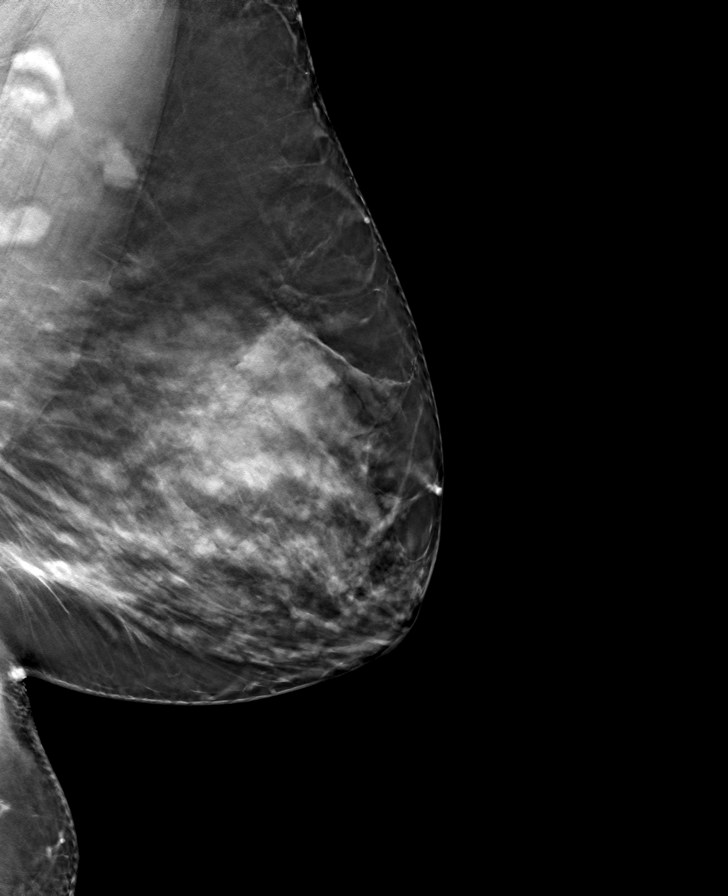

[8 of 24 positions shown; findings below may reference images not displayed]

ACR Breast Density Category c: The breast tissue is heterogeneously
dense, which may obscure small masses.
FINDINGS: There are no findings suspicious for malignancy.
IMPRESSION: No mammographic evidence of malignancy. A result letter of this
screening mammogram will be mailed directly to the patient.

RECOMMENDATION:
Screening mammogram in one year. (Code:Q3-W-BC3)

BI-RADS CATEGORY  1: Negative.

## 2023-07-22 DIAGNOSIS — E118 Type 2 diabetes mellitus with unspecified complications: Secondary | ICD-10-CM | POA: Diagnosis not present

## 2023-07-22 DIAGNOSIS — E063 Autoimmune thyroiditis: Secondary | ICD-10-CM | POA: Diagnosis not present

## 2023-07-22 DIAGNOSIS — E538 Deficiency of other specified B group vitamins: Secondary | ICD-10-CM | POA: Diagnosis not present

## 2023-07-22 DIAGNOSIS — F419 Anxiety disorder, unspecified: Secondary | ICD-10-CM | POA: Diagnosis not present

## 2023-07-22 DIAGNOSIS — C50011 Malignant neoplasm of nipple and areola, right female breast: Secondary | ICD-10-CM | POA: Diagnosis not present

## 2023-07-22 DIAGNOSIS — D649 Anemia, unspecified: Secondary | ICD-10-CM | POA: Diagnosis not present

## 2023-07-22 DIAGNOSIS — J431 Panlobular emphysema: Secondary | ICD-10-CM | POA: Diagnosis not present

## 2023-07-22 DIAGNOSIS — Z79899 Other long term (current) drug therapy: Secondary | ICD-10-CM | POA: Diagnosis not present

## 2023-07-22 DIAGNOSIS — Z1211 Encounter for screening for malignant neoplasm of colon: Secondary | ICD-10-CM | POA: Diagnosis not present

## 2023-07-22 DIAGNOSIS — E7800 Pure hypercholesterolemia, unspecified: Secondary | ICD-10-CM | POA: Diagnosis not present

## 2023-09-10 ENCOUNTER — Other Ambulatory Visit: Payer: Self-pay | Admitting: Oncology

## 2023-09-22 DIAGNOSIS — E210 Primary hyperparathyroidism: Secondary | ICD-10-CM | POA: Diagnosis not present

## 2023-09-22 DIAGNOSIS — E1169 Type 2 diabetes mellitus with other specified complication: Secondary | ICD-10-CM | POA: Diagnosis not present

## 2023-09-22 DIAGNOSIS — E785 Hyperlipidemia, unspecified: Secondary | ICD-10-CM | POA: Diagnosis not present

## 2023-09-27 NOTE — Assessment & Plan Note (Addendum)
 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  Oncotype DX score is 10, no chemotherapy benefit. No need for adjuvant radiation as she has had a  right mastectomy.  She tolerates Arimidex 1mg  daily.  Continue current regimen.  Screening mammogram left breast unilateral-March 2025.  Mastectomy site intermittent sharp pain, physical examination showed no suspicious chest wall mass. Reassurance provided. Refer to lymph edema clinic

## 2023-09-28 ENCOUNTER — Encounter: Payer: Self-pay | Admitting: Oncology

## 2023-09-28 ENCOUNTER — Inpatient Hospital Stay (HOSPITAL_BASED_OUTPATIENT_CLINIC_OR_DEPARTMENT_OTHER): Payer: Medicaid Other | Admitting: Oncology

## 2023-09-28 ENCOUNTER — Inpatient Hospital Stay: Payer: 59 | Attending: Oncology

## 2023-09-28 VITALS — BP 116/74 | HR 76 | Temp 97.3°F | Resp 18 | Wt 185.7 lb

## 2023-09-28 DIAGNOSIS — C50211 Malignant neoplasm of upper-inner quadrant of right female breast: Secondary | ICD-10-CM | POA: Diagnosis not present

## 2023-09-28 DIAGNOSIS — C50911 Malignant neoplasm of unspecified site of right female breast: Secondary | ICD-10-CM

## 2023-09-28 DIAGNOSIS — Z79811 Long term (current) use of aromatase inhibitors: Secondary | ICD-10-CM | POA: Insufficient documentation

## 2023-09-28 DIAGNOSIS — C50919 Malignant neoplasm of unspecified site of unspecified female breast: Secondary | ICD-10-CM | POA: Diagnosis not present

## 2023-09-28 DIAGNOSIS — Z9011 Acquired absence of right breast and nipple: Secondary | ICD-10-CM | POA: Insufficient documentation

## 2023-09-28 LAB — CBC WITH DIFFERENTIAL (CANCER CENTER ONLY)
Abs Immature Granulocytes: 0.01 10*3/uL (ref 0.00–0.07)
Basophils Absolute: 0.1 10*3/uL (ref 0.0–0.1)
Basophils Relative: 1 %
Eosinophils Absolute: 0.2 10*3/uL (ref 0.0–0.5)
Eosinophils Relative: 3 %
HCT: 38.2 % (ref 36.0–46.0)
Hemoglobin: 11.4 g/dL — ABNORMAL LOW (ref 12.0–15.0)
Immature Granulocytes: 0 %
Lymphocytes Relative: 42 %
Lymphs Abs: 2.6 10*3/uL (ref 0.7–4.0)
MCH: 24.6 pg — ABNORMAL LOW (ref 26.0–34.0)
MCHC: 29.8 g/dL — ABNORMAL LOW (ref 30.0–36.0)
MCV: 82.3 fL (ref 80.0–100.0)
Monocytes Absolute: 0.5 10*3/uL (ref 0.1–1.0)
Monocytes Relative: 7 %
Neutro Abs: 2.8 10*3/uL (ref 1.7–7.7)
Neutrophils Relative %: 47 %
Platelet Count: 325 10*3/uL (ref 150–400)
RBC: 4.64 MIL/uL (ref 3.87–5.11)
RDW: 14.5 % (ref 11.5–15.5)
WBC Count: 6.1 10*3/uL (ref 4.0–10.5)
nRBC: 0 % (ref 0.0–0.2)

## 2023-09-28 LAB — CMP (CANCER CENTER ONLY)
ALT: 39 U/L (ref 0–44)
AST: 25 U/L (ref 15–41)
Albumin: 4 g/dL (ref 3.5–5.0)
Alkaline Phosphatase: 117 U/L (ref 38–126)
Anion gap: 9 (ref 5–15)
BUN: 14 mg/dL (ref 6–20)
CO2: 27 mmol/L (ref 22–32)
Calcium: 9.5 mg/dL (ref 8.9–10.3)
Chloride: 105 mmol/L (ref 98–111)
Creatinine: 0.72 mg/dL (ref 0.44–1.00)
GFR, Estimated: >60 mL/min (ref >60)
Glucose, Bld: 158 mg/dL — ABNORMAL HIGH (ref 70–99)
Potassium: 4 mmol/L (ref 3.5–5.1)
Sodium: 141 mmol/L (ref 135–145)
Total Bilirubin: 0.4 mg/dL (ref 0.0–1.2)
Total Protein: 6.9 g/dL (ref 6.5–8.1)

## 2023-09-28 MED ORDER — ANASTROZOLE 1 MG PO TABS: 1.0000 mg | ORAL_TABLET | Freq: Every day | ORAL | 2 refills | Status: DC

## 2023-09-28 NOTE — Assessment & Plan Note (Addendum)
 Recommend  vitamin D supplementation.  06/10/23 DEXA normal bone density Per endocrinology, she is not able to take calcium  supplementation due to history of hyper calcium  due to primary hyperparathyroidism.
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
## 2023-09-28 NOTE — Progress Notes (Signed)
 Pt here for follow up. Pt reports that she has been having pain to right breast and joint pain

## 2023-09-30 ENCOUNTER — Inpatient Hospital Stay: Payer: 59 | Admitting: Occupational Therapy

## 2023-09-30 DIAGNOSIS — M25611 Stiffness of right shoulder, not elsewhere classified: Secondary | ICD-10-CM

## 2023-09-30 DIAGNOSIS — C50919 Malignant neoplasm of unspecified site of unspecified female breast: Secondary | ICD-10-CM
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
## 2023-09-30 NOTE — Progress Notes (Signed)
Survivorship Care Plan visit completed.  Treatment summary reviewed and given to patient.  ASCO answers booklet reviewed and given to patient.  CARE program and Cancer Transitions discussed with patient along with other resources cancer center offers to patients and caregivers.  Patient verbalized understanding.

## 2023-10-01 ENCOUNTER — Telehealth: Payer: Self-pay

## 2023-10-01 NOTE — Telephone Encounter (Signed)
Order for Unilateral postmastecomy Jovi pack breast pad faxed to [facility]

## 2023-10-12 ENCOUNTER — Encounter: Payer: Self-pay | Admitting: Occupational Therapy

## 2023-10-21 ENCOUNTER — Ambulatory Visit: Payer: 59 | Admitting: Occupational Therapy

## 2023-11-03 ENCOUNTER — Telehealth: Payer: Self-pay | Admitting: *Deleted

## 2023-11-03 NOTE — Telephone Encounter (Signed)
 The patient got a call from [facility] 3/13 and hopes to get her needs in next week. The pt. Will make appt once she gets the products to use.

## 2023-11-11 ENCOUNTER — Ambulatory Visit
Admission: RE | Admit: 2023-11-11 | Discharge: 2023-11-11 | Disposition: A | Discharge status: Home / Self Care | Payer: 59 | Source: Ambulatory Visit | Attending: Oncology | Admitting: Oncology

## 2023-11-11 DIAGNOSIS — Z853 Personal history of malignant neoplasm of breast: Secondary | ICD-10-CM | POA: Diagnosis present

## 2023-11-11 DIAGNOSIS — C50919 Malignant neoplasm of unspecified site of unspecified female breast: Secondary | ICD-10-CM | POA: Diagnosis present

## 2023-11-11 DIAGNOSIS — Z1231 Encounter for screening mammogram for malignant neoplasm of breast: Secondary | ICD-10-CM | POA: Diagnosis present

## 2023-11-25 ENCOUNTER — Inpatient Hospital Stay: Attending: Oncology | Admitting: Occupational Therapy

## 2023-11-25 DIAGNOSIS — M25611 Stiffness of right shoulder, not elsewhere classified: Secondary | ICD-10-CM

## 2023-11-25 DIAGNOSIS — I890 Lymphedema, not elsewhere classified: Secondary | ICD-10-CM
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
## 2023-12-25 ENCOUNTER — Other Ambulatory Visit: Payer: Self-pay | Admitting: Oncology

## 2024-01-07 ENCOUNTER — Encounter: Payer: Self-pay | Admitting: Gastroenterology

## 2024-01-23 ENCOUNTER — Other Ambulatory Visit: Payer: Self-pay | Admitting: Oncology

## 2024-02-01 ENCOUNTER — Other Ambulatory Visit: Payer: Self-pay

## 2024-02-01 ENCOUNTER — Encounter: Payer: Self-pay | Admitting: Gastroenterology

## 2024-02-01 ENCOUNTER — Ambulatory Visit: Admitting: Anesthesiology

## 2024-02-01 ENCOUNTER — Ambulatory Visit
Admission: RE | Admit: 2024-02-01 | Discharge: 2024-02-01 | Disposition: A | Discharge status: Home / Self Care | Attending: Gastroenterology | Admitting: Gastroenterology

## 2024-02-01 ENCOUNTER — Encounter
Admission: RE | Disposition: A | Discharge status: Home / Self Care | Payer: Self-pay | Source: Home / Self Care | Attending: Gastroenterology

## 2024-02-01 DIAGNOSIS — K635 Polyp of colon: Secondary | ICD-10-CM | POA: Diagnosis not present

## 2024-02-01 DIAGNOSIS — K5900 Constipation, unspecified: Secondary | ICD-10-CM | POA: Insufficient documentation

## 2024-02-01 DIAGNOSIS — Z7985 Long-term (current) use of injectable non-insulin antidiabetic drugs: Secondary | ICD-10-CM | POA: Diagnosis not present

## 2024-02-01 DIAGNOSIS — D123 Benign neoplasm of transverse colon: Secondary | ICD-10-CM | POA: Insufficient documentation

## 2024-02-01 DIAGNOSIS — D122 Benign neoplasm of ascending colon: Secondary | ICD-10-CM | POA: Diagnosis not present

## 2024-02-01 DIAGNOSIS — E119 Type 2 diabetes mellitus without complications: Secondary | ICD-10-CM | POA: Diagnosis not present

## 2024-02-01 DIAGNOSIS — Z1211 Encounter for screening for malignant neoplasm of colon: Secondary | ICD-10-CM | POA: Insufficient documentation

## 2024-02-01 DIAGNOSIS — Z7984 Long term (current) use of oral hypoglycemic drugs: Secondary | ICD-10-CM | POA: Diagnosis not present

## 2024-02-01 DIAGNOSIS — K621 Rectal polyp: Secondary | ICD-10-CM | POA: Diagnosis not present

## 2024-02-01 DIAGNOSIS — K5730 Diverticulosis of large intestine without perforation or abscess without bleeding: Secondary | ICD-10-CM | POA: Insufficient documentation

## 2024-02-01 HISTORY — PX: COLONOSCOPY: SHX5424

## 2024-02-01 HISTORY — PX: POLYPECTOMY: SHX149

## 2024-02-01 LAB — GLUCOSE, CAPILLARY: Glucose-Capillary: 101 mg/dL — ABNORMAL HIGH (ref 70–99)

## 2024-02-01 SURGERY — COLONOSCOPY
Anesthesia: General

## 2024-02-01 MED ORDER — DEXMEDETOMIDINE HCL IN NACL 80 MCG/20ML IV SOLN
INTRAVENOUS | Status: DC | PRN
  Administered 2024-02-01: 20 ug via INTRAVENOUS

## 2024-02-01 MED ORDER — PROPOFOL 10 MG/ML IV BOLUS
INTRAVENOUS | Status: DC | PRN
  Administered 2024-02-01: 50 mg via INTRAVENOUS
  Administered 2024-02-01: 30 mg via INTRAVENOUS

## 2024-02-01 MED ORDER — EPHEDRINE 5 MG/ML INJ
INTRAVENOUS | Status: AC
  Filled 2024-02-01: qty 5

## 2024-02-01 MED ORDER — LIDOCAINE HCL (PF) 2 % IJ SOLN
INTRAMUSCULAR | Status: AC
Start: 2024-02-01 — End: 2024-02-01
  Filled 2024-02-01: qty 5

## 2024-02-01 MED ORDER — LIDOCAINE HCL (CARDIAC) PF 100 MG/5ML IV SOSY
PREFILLED_SYRINGE | INTRAVENOUS | Status: DC | PRN
  Administered 2024-02-01: 60 mg via INTRAVENOUS

## 2024-02-01 MED ORDER — PROPOFOL 500 MG/50ML IV EMUL
INTRAVENOUS | Status: DC | PRN
  Administered 2024-02-01: 75 ug/kg/min via INTRAVENOUS

## 2024-02-01 MED ORDER — SODIUM CHLORIDE 0.9 % IV SOLN: INTRAVENOUS | Status: DC

## 2024-02-01 NOTE — Op Note (Signed)
 [facility] Patient Name: Tina Payne Procedure Date: 02/01/2024 2:02 PM MRN: [id number] Account #: [id number] Date of Birth: [birth year] Admit Type: Outpatient Age: 58 Room: [facility] ENDO ROOM 2 Gender: Female Note Status: Finalized Instrument Name: Colonoscope [id number] Procedure:             Colonoscopy Indications:           Screening for colorectal malignant neoplasm Providers:             Quintin Buckle DO, DO Medicines:             Monitored Anesthesia Care Complications:         No immediate complications. Estimated blood loss:                         Minimal. Procedure:             Pre-Anesthesia Assessment:                        - Prior to the procedure, a History and Physical was                         performed, and patient medications and allergies were                         reviewed. The patient is competent. The risks and                         benefits of the procedure and the sedation options and                         risks were discussed with the patient. All questions                         were answered and informed consent was obtained.                         Patient identification and proposed procedure were                         verified by the physician, the nurse, the anesthetist                         and the technician in the endoscopy suite. Mental                         Status Examination: alert and oriented. Airway                         Examination: normal oropharyngeal airway and neck                         mobility. Respiratory Examination: clear to                         auscultation. CV Examination: RRR, no murmurs, no S3                         or S4. Prophylactic Antibiotics: The patient does not  require prophylactic antibiotics. Prior                         Anticoagulants: The patient has taken no anticoagulant                         or antiplatelet agents. ASA Grade  Assessment: III - A                         patient with severe systemic disease. After reviewing                         the risks and benefits, the patient was deemed in                         satisfactory condition to undergo the procedure. The                         anesthesia plan was to use monitored anesthesia care                         (MAC). Immediately prior to administration of                         medications, the patient was re-assessed for adequacy                         to receive sedatives. The heart rate, respiratory                         rate, oxygen saturations, blood pressure, adequacy of                         pulmonary ventilation, and response to care were                         monitored throughout the procedure. The physical                         status of the patient was re-assessed after the                         procedure.                        After obtaining informed consent, the colonoscope was                         passed under direct vision. Throughout the procedure,                         the patient's blood pressure, pulse, and oxygen                         saturations were monitored continuously. The                         Colonoscope was introduced through the anus and  advanced to the the terminal ileum, with                         identification of the appendiceal orifice and IC                         valve. The colonoscopy was performed without                         difficulty. The patient tolerated the procedure well.                         The quality of the bowel preparation was evaluated                         using the BBPS [facility] Bowel Preparation Scale) with                         scores of: Right Colon = 3, Transverse Colon = 3 and                         Left Colon = 3 (entire mucosa seen well with no                         residual staining, small fragments of stool or opaque                          liquid). The total BBPS score equals 9. The terminal                         ileum, ileocecal valve, appendiceal orifice, and                         rectum were photographed. Findings:      The perianal and digital rectal examinations were normal. Pertinent       negatives include normal sphincter tone.      The terminal ileum appeared normal. Estimated blood loss: none.      A few small-mouthed diverticula were found in the recto-sigmoid colon.       Estimated blood loss: none.      There was a medium-sized lipoma, in the descending colon. + pillow sign       Estimated blood loss: none.      Two sessile polyps were found in the ascending colon. The polyps were 3       to 5 mm in size. These polyps were removed with a cold snare. Resection       and retrieval were complete. Estimated blood loss was minimal.      Seven sessile polyps were found in the rectum (1), sigmoid colon (2),       descending colon (1), transverse colon (2) and ascending colon (1). The       polyps were 1 to 2 mm in size. These polyps were removed with a jumbo       cold forceps. Resection and retrieval were complete. Estimated blood       loss was minimal.      The exam was otherwise without abnormality on direct and retroflexion  views. Impression:            - The examined portion of the ileum was normal.                        - Diverticulosis in the recto-sigmoid colon.                        - Medium-sized lipoma in the descending colon.                        - Two 3 to 5 mm polyps in the ascending colon, removed                         with a cold snare. Resected and retrieved.                        - Seven 1 to 2 mm polyps in the rectum, in the sigmoid                         colon, in the descending colon, in the transverse                         colon and in the ascending colon, removed with a jumbo                         cold forceps. Resected and retrieved.                        -  The examination was otherwise normal on direct and                         retroflexion views. Recommendation:        - Patient has a contact number available for                         emergencies. The signs and symptoms of potential                         delayed complications were discussed with the patient.                         Return to normal activities tomorrow. Written                         discharge instructions were provided to the patient.                        - Discharge patient to home.                        - Resume previous diet.                        - Continue present medications.                        - Await pathology results.                        -  Repeat colonoscopy for surveillance based on                         pathology results.                        - Return to referring physician as previously                         scheduled.                        - No ibuprofen , naproxen, or other non-steroidal                         anti-inflammatory drugs for 5 days after polyp removal.                        - The findings and recommendations were discussed with                         the patient. Procedure Code(s):     --- Professional ---                        [phone], Colonoscopy, flexible; with removal of                         tumor(s), polyp(s), or other lesion(s) by snare                         technique                        [id number], 59, Colonoscopy, flexible; with biopsy, single                         or multiple Diagnosis Code(s):     --- Professional ---                        Z12.11, Encounter for screening for malignant neoplasm                         of colon                        D17.5, Benign lipomatous neoplasm of intra-abdominal                         organs                        D12.8, Benign neoplasm of rectum                        D12.5, Benign neoplasm of sigmoid colon                        D12.4, Benign neoplasm of  descending colon                        D12.3, Benign neoplasm of transverse colon (hepatic  flexure or splenic flexure)                        D12.2, Benign neoplasm of ascending colon                        K57.30, Diverticulosis of large intestine without                         perforation or abscess without bleeding CPT copyright 2022 American Medical Association. All rights reserved. The codes documented in this report are preliminary and upon coder review may  be revised to meet current compliance requirements. Attending Participation:      I personally performed the entire procedure. Polo Brisk, DO Quintin Buckle DO, DO 02/01/2024 2:44:24 PM This report has been signed electronically. Number of Addenda: 0 Note Initiated On: 02/01/2024 2:02 PM Scope Withdrawal Time: 0 hours 13 minutes 28 seconds  Total Procedure Duration: 0 hours 22 minutes 40 seconds  Estimated Blood Loss:  Estimated blood loss was minimal.      [facility]
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
## 2024-02-01 NOTE — Anesthesia Postprocedure Evaluation (Signed)
 Anesthesia Post Note  Patient: Tina Payne  Procedure(s) Performed: COLONOSCOPY POLYPECTOMY, INTESTINE  Patient location during evaluation: Endoscopy Anesthesia Type: General Level of consciousness: awake and alert Pain management: pain level controlled Vital Signs Assessment: post-procedure vital signs reviewed and stable Respiratory status: spontaneous breathing, nonlabored ventilation, respiratory function stable and patient connected to nasal cannula oxygen Cardiovascular status: blood pressure returned to baseline and stable Postop Assessment: no apparent nausea or vomiting Anesthetic complications: no   No notable events documented.   Last Vitals:  Vitals:   02/01/24 1451 02/01/24 1501  BP: 125/89 128/80  Pulse: 73 70  Resp: 20 15  Temp:    SpO2: 100% 100%    Last Pain:  Vitals:   02/01/24 1501  TempSrc:   PainSc: 0-No pain                 Tina Payne

## 2024-02-01 NOTE — Interval H&P Note (Signed)
 History and Physical Interval Note: Preprocedure H&P from 02/01/24  was reviewed and there was no interval change after seeing and examining the patient.  Written consent was obtained from the patient after discussion of risks, benefits, and alternatives. Patient has consented to proceed with Colonoscopy with possible intervention   02/01/2024 2:03 PM  Tina Payne  has presented today for surgery, with the diagnosis of Colon cancer screening (Z12.11).  The various methods of treatment have been discussed with the patient and family. After consideration of risks, benefits and other options for treatment, the patient has consented to  Procedure(s): COLONOSCOPY (N/A) as a surgical intervention.  The patient's history has been reviewed, patient examined, no change in status, stable for surgery.  I have reviewed the patient's chart and labs.  Questions were answered to the patient's satisfaction.     Quintin Buckle

## 2024-02-01 NOTE — Transfer of Care (Signed)
 Immediate Anesthesia Transfer of Care Note  Patient: Tina Payne  Procedure(s) Performed: COLONOSCOPY POLYPECTOMY, INTESTINE  Patient Location: PACU  Anesthesia Type:General  Level of Consciousness: sedated  Airway & Oxygen Therapy: Patient Spontanous Breathing  Post-op Assessment: Report given to RN and Post -op Vital signs reviewed and stable  Post vital signs: Reviewed and stable  Last Vitals:  Vitals Value Taken Time  BP    Temp    Pulse 73 02/01/24 14:41  Resp 16 02/01/24 14:41  SpO2 99 % 02/01/24 14:41  Vitals shown include unfiled device data.  Last Pain:  Vitals:   02/01/24 1249  TempSrc: Temporal  PainSc: 0-No pain         Complications: No notable events documented.

## 2024-02-02 ENCOUNTER — Encounter: Payer: Self-pay | Admitting: Gastroenterology

## 2024-02-02 LAB — SURGICAL PATHOLOGY

## 2024-03-16 ENCOUNTER — Inpatient Hospital Stay: Attending: Oncology | Admitting: Occupational Therapy

## 2024-03-16 DIAGNOSIS — I890 Lymphedema, not elsewhere classified: Secondary | ICD-10-CM
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
## 2024-03-28 ENCOUNTER — Inpatient Hospital Stay: Payer: 59

## 2024-03-28 ENCOUNTER — Inpatient Hospital Stay: Payer: 59 | Admitting: Oncology

## 2024-03-28 NOTE — Assessment & Plan Note (Deleted)
 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  Oncotype DX score is 10, no chemotherapy benefit. No need for adjuvant radiation as she has had a  right mastectomy.  She tolerates Arimidex 1mg  daily.  Continue current regimen.  Screening mammogram left breast unilateral-March 2025.  Mastectomy site intermittent sharp pain, physical examination showed no suspicious chest wall mass. Reassurance provided. Refer to lymph edema clinic

## 2024-04-06 ENCOUNTER — Ambulatory Visit: Payer: Self-pay | Admitting: Licensed Clinical Social Worker

## 2024-04-06 ENCOUNTER — Encounter: Payer: Self-pay | Admitting: Licensed Clinical Social Worker

## 2024-04-19 ENCOUNTER — Other Ambulatory Visit: Payer: Self-pay | Admitting: Oncology

## 2024-05-02 ENCOUNTER — Encounter: Payer: Self-pay | Admitting: Oncology

## 2024-05-02 ENCOUNTER — Inpatient Hospital Stay (HOSPITAL_BASED_OUTPATIENT_CLINIC_OR_DEPARTMENT_OTHER): Admitting: Oncology

## 2024-05-02 ENCOUNTER — Ambulatory Visit: Payer: Self-pay | Admitting: Oncology

## 2024-05-02 ENCOUNTER — Other Ambulatory Visit: Payer: Self-pay

## 2024-05-02 ENCOUNTER — Inpatient Hospital Stay: Attending: Oncology

## 2024-05-02 VITALS — BP 129/76 | HR 83 | Temp 98.1°F | Resp 18 | Wt 192.6 lb

## 2024-05-02 DIAGNOSIS — Z87891 Personal history of nicotine dependence: Secondary | ICD-10-CM | POA: Diagnosis not present

## 2024-05-02 DIAGNOSIS — D509 Iron deficiency anemia, unspecified: Secondary | ICD-10-CM | POA: Insufficient documentation

## 2024-05-02 DIAGNOSIS — D500 Iron deficiency anemia secondary to blood loss (chronic): Secondary | ICD-10-CM

## 2024-05-02 DIAGNOSIS — C50211 Malignant neoplasm of upper-inner quadrant of right female breast: Secondary | ICD-10-CM | POA: Diagnosis present

## 2024-05-02 DIAGNOSIS — Z9011 Acquired absence of right breast and nipple: Secondary | ICD-10-CM | POA: Insufficient documentation

## 2024-05-02 DIAGNOSIS — Z801 Family history of malignant neoplasm of trachea, bronchus and lung: Secondary | ICD-10-CM | POA: Insufficient documentation

## 2024-05-02 DIAGNOSIS — C50919 Malignant neoplasm of unspecified site of unspecified female breast: Secondary | ICD-10-CM

## 2024-05-02 DIAGNOSIS — E210 Primary hyperparathyroidism: Secondary | ICD-10-CM | POA: Insufficient documentation

## 2024-05-02 DIAGNOSIS — Z1732 Human epidermal growth factor receptor 2 negative status: Secondary | ICD-10-CM | POA: Diagnosis not present

## 2024-05-02 DIAGNOSIS — Z79811 Long term (current) use of aromatase inhibitors: Secondary | ICD-10-CM | POA: Diagnosis not present

## 2024-05-02 DIAGNOSIS — Z170 Estrogen receptor positive status [ER+]: Secondary | ICD-10-CM | POA: Insufficient documentation

## 2024-05-02 DIAGNOSIS — Z8701 Personal history of pneumonia (recurrent): Secondary | ICD-10-CM | POA: Insufficient documentation

## 2024-05-02 LAB — CBC WITH DIFFERENTIAL (CANCER CENTER ONLY)
Abs Immature Granulocytes: 0.03 K/uL (ref 0.00–0.07)
Basophils Absolute: 0 K/uL (ref 0.0–0.1)
Basophils Relative: 1 %
Eosinophils Absolute: 0.1 K/uL (ref 0.0–0.5)
Eosinophils Relative: 2 %
HCT: 36.2 % (ref 36.0–46.0)
Hemoglobin: 10.8 g/dL — ABNORMAL LOW (ref 12.0–15.0)
Immature Granulocytes: 0 %
Lymphocytes Relative: 44 %
Lymphs Abs: 3.4 K/uL (ref 0.7–4.0)
MCH: 23.9 pg — ABNORMAL LOW (ref 26.0–34.0)
MCHC: 29.8 g/dL — ABNORMAL LOW (ref 30.0–36.0)
MCV: 80.1 fL (ref 80.0–100.0)
Monocytes Absolute: 0.8 K/uL (ref 0.1–1.0)
Monocytes Relative: 10 %
Neutro Abs: 3.4 K/uL (ref 1.7–7.7)
Neutrophils Relative %: 43 %
Platelet Count: 404 K/uL — ABNORMAL HIGH (ref 150–400)
RBC: 4.52 MIL/uL (ref 3.87–5.11)
RDW: 14.5 % (ref 11.5–15.5)
WBC Count: 7.7 K/uL (ref 4.0–10.5)
nRBC: 0 % (ref 0.0–0.2)

## 2024-05-02 LAB — RETIC PANEL
Immature Retic Fract: 26.8 % — ABNORMAL HIGH (ref 2.3–15.9)
RBC.: 4.56 MIL/uL (ref 3.87–5.11)
Retic Count, Absolute: 64.3 K/uL (ref 19.0–186.0)
Retic Ct Pct: 1.4 % (ref 0.4–3.1)
Reticulocyte Hemoglobin: 23.6 pg — ABNORMAL LOW (ref >27.9)

## 2024-05-02 LAB — CMP (CANCER CENTER ONLY)
ALT: 42 U/L (ref 0–44)
AST: 28 U/L (ref 15–41)
Albumin: 4.3 g/dL (ref 3.5–5.0)
Alkaline Phosphatase: 148 U/L — ABNORMAL HIGH (ref 38–126)
Anion gap: 9 (ref 5–15)
BUN: 15 mg/dL (ref 6–20)
CO2: 28 mmol/L (ref 22–32)
Calcium: 10.4 mg/dL — ABNORMAL HIGH (ref 8.9–10.3)
Chloride: 102 mmol/L (ref 98–111)
Creatinine: 0.98 mg/dL (ref 0.44–1.00)
GFR, Estimated: >60 mL/min (ref >60)
Glucose, Bld: 80 mg/dL (ref 70–99)
Potassium: 4.1 mmol/L (ref 3.5–5.1)
Sodium: 139 mmol/L (ref 135–145)
Total Bilirubin: 0.5 mg/dL (ref 0.0–1.2)
Total Protein: 7.3 g/dL (ref 6.5–8.1)

## 2024-05-02 LAB — FOLATE: Folate: >20 ng/mL (ref >5.9)

## 2024-05-02 LAB — FERRITIN: Ferritin: 10 ng/mL — ABNORMAL LOW (ref 11–307)

## 2024-05-02 LAB — IRON AND TIBC
Iron: 41 ug/dL (ref 28–170)
Saturation Ratios: 9 % — ABNORMAL LOW (ref 10.4–31.8)
TIBC: 472 ug/dL — ABNORMAL HIGH (ref 250–450)
UIBC: 431 ug/dL

## 2024-05-02 MED ORDER — FERROUS SULFATE DRIED ER 160 (50 FE) MG PO TBCR: 160.0000 mg | EXTENDED_RELEASE_TABLET | Freq: Every day | ORAL | 5 refills | Status: AC

## 2024-05-02 MED ORDER — ANASTROZOLE 1 MG PO TABS: 1.0000 mg | ORAL_TABLET | Freq: Every day | ORAL | 1 refills | Status: AC

## 2024-05-02 NOTE — Assessment & Plan Note (Addendum)
 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  Oncotype DX score is 10, no chemotherapy benefit. No need for adjuvant radiation as she has had a  right mastectomy.  She tolerates Arimidex  1mg  daily.  Continue current regimen.  Screening mammogram left breast unilateral-March 2026  Mastectomy site intermittent sharp pain, physical examination showed no suspicious chest wall mass. Reassurance provided. Follow-up with lymph edema clinic
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
## 2024-05-02 NOTE — Assessment & Plan Note (Addendum)
 Lab Results  Component Value Date   HGB 10.8 (L) 05/02/2024   TIBC 472 (H) 05/02/2024   IRONPCTSAT 9 (L) 05/02/2024   FERRITIN 10 (L) 05/02/2024    Blood work results were consistent with iron deficiency anemia. Recommend patient to take iron supplementation. Colonoscopy was done in June 2025 with small polyps removed.

## 2024-05-02 NOTE — Assessment & Plan Note (Signed)
 Recommend  vitamin D supplementation.  06/10/23 DEXA normal bone density Per endocrinology, she is not able to take calcium  supplementation due to history of hyper calcium  due to primary hyperparathyroidism.

## 2024-05-02 NOTE — Addendum Note (Signed)
 Addended by: BABARA CALL on: 05/02/2024 08:27 PM   Modules accepted: Orders

## 2024-05-03 NOTE — Progress Notes (Signed)
  Called and spoke to pt and infomed her of low iron levels and that Dr. Babara recommneds to try Slow Fe. Informed her that medicatoin can also be found over-the-counter. Also infomed her that Dr. Babara recommends GI follow up for IDA. Pt verbalized understanding.

## 2024-11-21 ENCOUNTER — Other Ambulatory Visit

## 2024-11-21 ENCOUNTER — Ambulatory Visit: Admitting: Oncology
